# Patient Record
Sex: Female | Born: 1955 | Race: Black or African American | Hispanic: No | Marital: Married | Smoking: Former smoker
Health system: Southern US, Community
[De-identification: ages and names within clinical notes are randomized; demographics above are authoritative.]

## PROBLEM LIST (undated history)

## (undated) DIAGNOSIS — D649 Anemia, unspecified: Secondary | ICD-10-CM

## (undated) DIAGNOSIS — I4891 Unspecified atrial fibrillation: Secondary | ICD-10-CM

## (undated) DIAGNOSIS — M1990 Unspecified osteoarthritis, unspecified site: Secondary | ICD-10-CM

## (undated) DIAGNOSIS — I10 Essential (primary) hypertension: Secondary | ICD-10-CM

## (undated) HISTORY — PX: ABDOMINAL HYSTERECTOMY: SHX81

---

## 1998-02-05 ENCOUNTER — Encounter: Payer: Self-pay | Admitting: Obstetrics and Gynecology

## 1998-02-05 ENCOUNTER — Ambulatory Visit (HOSPITAL_COMMUNITY): Admission: RE | Admit: 1998-02-05 | Discharge: 1998-02-05 | Payer: Self-pay | Admitting: Obstetrics and Gynecology

## 1998-06-28 ENCOUNTER — Ambulatory Visit (HOSPITAL_COMMUNITY): Admission: RE | Admit: 1998-06-28 | Discharge: 1998-06-28 | Payer: Self-pay | Admitting: Cardiology

## 2000-01-16 ENCOUNTER — Encounter: Payer: Self-pay | Admitting: Obstetrics and Gynecology

## 2000-01-16 ENCOUNTER — Ambulatory Visit (HOSPITAL_COMMUNITY): Admission: RE | Admit: 2000-01-16 | Discharge: 2000-01-16 | Payer: Self-pay | Admitting: Obstetrics and Gynecology

## 2000-02-10 ENCOUNTER — Other Ambulatory Visit: Admission: RE | Admit: 2000-02-10 | Discharge: 2000-02-10 | Payer: Self-pay | Admitting: Obstetrics and Gynecology

## 2001-02-05 ENCOUNTER — Ambulatory Visit (HOSPITAL_COMMUNITY): Admission: RE | Admit: 2001-02-05 | Discharge: 2001-02-05 | Payer: Self-pay | Admitting: Family Medicine

## 2001-02-05 ENCOUNTER — Encounter: Payer: Self-pay | Admitting: Family Medicine

## 2001-04-14 HISTORY — PX: REDUCTION MAMMAPLASTY: SUR839

## 2001-08-02 ENCOUNTER — Encounter (INDEPENDENT_AMBULATORY_CARE_PROVIDER_SITE_OTHER): Payer: Self-pay | Admitting: Specialist

## 2001-08-02 ENCOUNTER — Ambulatory Visit (HOSPITAL_COMMUNITY): Admission: RE | Admit: 2001-08-02 | Discharge: 2001-08-02 | Payer: Self-pay | Admitting: *Deleted

## 2001-09-23 ENCOUNTER — Encounter (INDEPENDENT_AMBULATORY_CARE_PROVIDER_SITE_OTHER): Payer: Self-pay

## 2001-09-23 ENCOUNTER — Ambulatory Visit (HOSPITAL_BASED_OUTPATIENT_CLINIC_OR_DEPARTMENT_OTHER): Admission: RE | Admit: 2001-09-23 | Discharge: 2001-09-24 | Payer: Self-pay | Admitting: Plastic Surgery

## 2002-06-28 ENCOUNTER — Encounter: Payer: Self-pay | Admitting: Emergency Medicine

## 2002-06-28 ENCOUNTER — Emergency Department (HOSPITAL_COMMUNITY): Admission: EM | Admit: 2002-06-28 | Discharge: 2002-06-28 | Payer: Self-pay | Admitting: Emergency Medicine

## 2003-02-01 ENCOUNTER — Ambulatory Visit (HOSPITAL_COMMUNITY): Admission: RE | Admit: 2003-02-01 | Discharge: 2003-02-01 | Payer: Self-pay | Admitting: Family Medicine

## 2003-02-01 ENCOUNTER — Encounter: Payer: Self-pay | Admitting: Family Medicine

## 2003-10-06 ENCOUNTER — Other Ambulatory Visit: Admission: RE | Admit: 2003-10-06 | Discharge: 2003-10-06 | Payer: Self-pay | Admitting: Family Medicine

## 2004-04-03 ENCOUNTER — Ambulatory Visit (HOSPITAL_COMMUNITY): Admission: RE | Admit: 2004-04-03 | Discharge: 2004-04-03 | Payer: Self-pay | Admitting: Family Medicine

## 2004-04-25 ENCOUNTER — Encounter: Admission: RE | Admit: 2004-04-25 | Discharge: 2004-04-25 | Payer: Self-pay | Admitting: Family Medicine

## 2004-11-26 ENCOUNTER — Other Ambulatory Visit: Admission: RE | Admit: 2004-11-26 | Discharge: 2004-11-26 | Payer: Self-pay | Admitting: Family Medicine

## 2005-05-19 ENCOUNTER — Ambulatory Visit (HOSPITAL_COMMUNITY): Admission: RE | Admit: 2005-05-19 | Discharge: 2005-05-19 | Payer: Self-pay | Admitting: Family Medicine

## 2006-02-10 ENCOUNTER — Other Ambulatory Visit: Admission: RE | Admit: 2006-02-10 | Discharge: 2006-02-10 | Payer: Self-pay | Admitting: Family Medicine

## 2006-04-28 ENCOUNTER — Encounter: Admission: RE | Admit: 2006-04-28 | Discharge: 2006-04-28 | Payer: Self-pay | Admitting: Family Medicine

## 2006-05-22 ENCOUNTER — Ambulatory Visit (HOSPITAL_COMMUNITY): Admission: RE | Admit: 2006-05-22 | Discharge: 2006-05-22 | Payer: Self-pay | Admitting: Family Medicine

## 2007-05-31 ENCOUNTER — Ambulatory Visit (HOSPITAL_COMMUNITY): Admission: RE | Admit: 2007-05-31 | Discharge: 2007-05-31 | Payer: Self-pay | Admitting: Family Medicine

## 2008-05-17 ENCOUNTER — Other Ambulatory Visit: Admission: RE | Admit: 2008-05-17 | Discharge: 2008-05-17 | Payer: Self-pay | Admitting: Family Medicine

## 2008-06-07 ENCOUNTER — Ambulatory Visit (HOSPITAL_COMMUNITY): Admission: RE | Admit: 2008-06-07 | Discharge: 2008-06-07 | Payer: Self-pay | Admitting: Family Medicine

## 2008-07-20 ENCOUNTER — Encounter: Admission: RE | Admit: 2008-07-20 | Discharge: 2008-07-20 | Payer: Self-pay | Admitting: Family Medicine

## 2009-06-11 ENCOUNTER — Ambulatory Visit (HOSPITAL_COMMUNITY): Admission: RE | Admit: 2009-06-11 | Discharge: 2009-06-11 | Payer: Self-pay | Admitting: Family Medicine

## 2010-05-07 ENCOUNTER — Other Ambulatory Visit: Payer: Self-pay | Admitting: Family Medicine

## 2010-05-07 DIAGNOSIS — Z1239 Encounter for other screening for malignant neoplasm of breast: Secondary | ICD-10-CM

## 2010-06-12 ENCOUNTER — Ambulatory Visit: Payer: Self-pay

## 2010-06-13 ENCOUNTER — Other Ambulatory Visit (HOSPITAL_COMMUNITY): Payer: Self-pay | Admitting: Family Medicine

## 2010-06-13 ENCOUNTER — Ambulatory Visit: Payer: Self-pay

## 2010-06-13 DIAGNOSIS — Z1231 Encounter for screening mammogram for malignant neoplasm of breast: Secondary | ICD-10-CM

## 2010-06-19 ENCOUNTER — Ambulatory Visit (HOSPITAL_COMMUNITY)
Admission: RE | Admit: 2010-06-19 | Discharge: 2010-06-19 | Disposition: A | Discharge status: Home / Self Care | Payer: BC Managed Care – PPO | Source: Ambulatory Visit | Attending: Family Medicine | Admitting: Family Medicine

## 2010-06-19 DIAGNOSIS — Z1231 Encounter for screening mammogram for malignant neoplasm of breast: Secondary | ICD-10-CM | POA: Insufficient documentation

## 2010-06-24 ENCOUNTER — Other Ambulatory Visit (HOSPITAL_COMMUNITY)
Admission: RE | Admit: 2010-06-24 | Discharge: 2010-06-24 | Disposition: A | Discharge status: Home / Self Care | Payer: BC Managed Care – PPO | Source: Ambulatory Visit | Attending: Family Medicine | Admitting: Family Medicine

## 2010-06-24 ENCOUNTER — Other Ambulatory Visit: Payer: Self-pay | Admitting: Family Medicine

## 2010-06-24 DIAGNOSIS — Z124 Encounter for screening for malignant neoplasm of cervix: Secondary | ICD-10-CM | POA: Insufficient documentation

## 2010-08-30 NOTE — Op Note (Signed)
Marissa Torres. [facility]  Patient:    Marissa Torres, Marissa Torres Visit Number: [id number] MRN: [id number]          Service Type: DSU Location: [facility] Attending Physician:  Eloise Levels Dictated by:   Mary A. Contogiannis, M.D. Proc. Date: 09/23/01 Admit Date:  09/23/2001 Discharge Date: 09/24/2001   CC:         Harl Bowie, M.D.   Operative Report  PREOPERATIVE DIAGNOSIS:  Bilateral macromastia.  POSTOPERATIVE DIAGNOSIS:  Bilateral macromastia.  PROCEDURE:  Bilateral reduction mammoplasties.  SURGEON:  Mary A. Contogiannis, M.D.  ASSISTANT:  Harl Bowie, M.D.  ANESTHESIA:  General endotracheal.  ANESTHESIOLOGISTS:  Maren Beach, M.D., and Bedelia Person, M.D.  ESTIMATED BLOOD LOSS:  225 cc.  FLUID REPLACEMENT:  1800 cc crystalloid.  URINE OUTPUT:  1000 cc.  COMPLICATIONS:  None.  JUSTIFICATION FOR OVERNIGHT STAY:  Progressive pain control along with ambulation and monitoring of the nipples and breast flaps.  AMOUNT OF BREAST TISSUE REMOVED:  Right breast, 794 g; left breast, 1072 g.  INDICATION FOR PROCEDURE:  The patient is a 55 year old African-American female who has bilateral macromastia that is clinically symptomatic.  She presents to undergo bilateral reduction mammoplasties.  DESCRIPTION OF PROCEDURE:  The patient was marked in the preop holding area in the pattern of Wise for the future bilateral reduction mammoplasties.  She was then taken back into the OR and placed on the table in supine position.  After adequate general endotracheal anesthesia was obtained, the patients chest was prepped with Betadine and alcohol and draped in sterile fashion.  The base of the breasts were then injected with 1% lidocaine with epinephrine.  After adequate hemostasis had taken effect, the procedure was begun.  First the right breast reduction was performed.  The nipple was marked with the 45 mm nipple marker.  This incision was then made  and the skin de-epithelialized around it down to the inframammary crease in the inferior pedicle pattern.  Next the medial, superior, and lateral skin flaps were elevated down to the chest wall.  Excess fat and glandular tissue were removed from the inferior pedicle.  The nipple was examined and found to be pink and viable.  The wound was irrigated with saline irrigation.  Meticulous hemostasis was obtained with the Bovie electrocautery.  The inferior pedicle was then centralized using 3-0 Prolene suture.  A #10 JP flat, fully-fluted drain was then placed into the wound.  The skin flaps were brought together at the inverted T junction with a 2-0 Prolene suture.  The incisions were stapled for temporary closure.  Attention was then turned to the left breast.  The nipple was marked with the 45 mm nipple marker, and this was incised.  The skin de-epithelialized around it down to the inframammary crease in the inferior pedicle pattern.  Next the medial, superior, and lateral skin flaps were elevated down to the chest wall.  The excess fat and glandular tissue were removed from the inferior pedicle. The nipple was examined and found to be pink and viable.  The inferior pedicle was centralized using 3-0 Prolene suture.  A #10 JP flat, fully-fluted drain was then placed into the wound and the skin flap was brought together at the inverted T junction with a 2-0 Prolene suture.  The incisions were stapled for temporary closure.  The breasts were then compared and found to have good shape and symmetry.  The staples were then removed and all the incisions closed  using 3-0 Monocryl in the dermal layer and a 4-0 Monocryl running intracuticular stitch on the skin.  The patient was then placed in the upright position and the location of the future nipples was marked using the 42 mm nipple marker.  She was then placed back to the recumbent position.  First the future nipple-areolar complex on the right  breast mound was incised.  This tissue was excised full-thickness. The nipple was then examined and found to be pink and viable.  It was then brought out into this aperture and sewn in place using 4-0 Monocryl interrupted dermal sutures followed by a 5-0 Monocryl running intracuticular stitch on the skin.  Attention was then turned to the left breast.  The area of the future nipple-areolar complex was then incised, and this tissue was excised full-thickness.  The nipple was then examined and was pink and viable. It was then brought out into this aperture and sewn in place using 4-0 Monocryl interrupted dermal sutures followed by a 5-0 Monocryl running intracuticular stitch on the skin.  The JP drains were sewn in place using 3-0 nylon suture.  The incisions were dressed with benzoin and Steri-Strips.  The nipples were additionally dressed with bacitracin ointment and Adaptic.  Four by fours were then placed over all the incisions and the patient was placed in a light postoperative support bra.  There were no complications.  The patient tolerated the procedure well.  Final needle and sponge counts were reported to be correct.  The patient was then extubated and taken to the recovery room in stable condition.  She will remain overnight in the [facility] for progressive pain control along with ambulation and monitoring of the nipples and breast flaps. Discharge is planned for the morning. Dictated by:   Mary A. Contogiannis, M.D. Attending Physician:  Eloise Levels DD:  09/23/01 TD:  09/25/01 Job: [id number]

## 2011-05-13 ENCOUNTER — Other Ambulatory Visit (HOSPITAL_COMMUNITY): Payer: Self-pay | Admitting: Family Medicine

## 2011-05-13 DIAGNOSIS — Z1231 Encounter for screening mammogram for malignant neoplasm of breast: Secondary | ICD-10-CM

## 2011-06-20 ENCOUNTER — Ambulatory Visit (HOSPITAL_COMMUNITY)
Admission: RE | Admit: 2011-06-20 | Discharge: 2011-06-20 | Disposition: A | Discharge status: Home / Self Care | Payer: BC Managed Care – PPO | Source: Ambulatory Visit | Attending: Family Medicine | Admitting: Family Medicine

## 2011-06-20 DIAGNOSIS — Z1231 Encounter for screening mammogram for malignant neoplasm of breast: Secondary | ICD-10-CM

## 2011-07-27 ENCOUNTER — Emergency Department (HOSPITAL_COMMUNITY): Payer: BC Managed Care – PPO

## 2011-07-27 ENCOUNTER — Emergency Department (HOSPITAL_COMMUNITY)
Admission: EM | Admit: 2011-07-27 | Discharge: 2011-07-27 | Disposition: A | Discharge status: Home / Self Care | Payer: BC Managed Care – PPO | Attending: Emergency Medicine | Admitting: Emergency Medicine

## 2011-07-27 ENCOUNTER — Encounter (HOSPITAL_COMMUNITY): Payer: Self-pay | Admitting: *Deleted

## 2011-07-27 DIAGNOSIS — T148XXA Other injury of unspecified body region, initial encounter: Secondary | ICD-10-CM | POA: Insufficient documentation

## 2011-07-27 DIAGNOSIS — M549 Dorsalgia, unspecified: Secondary | ICD-10-CM | POA: Insufficient documentation

## 2011-07-27 DIAGNOSIS — R109 Unspecified abdominal pain: Secondary | ICD-10-CM | POA: Insufficient documentation

## 2011-07-27 DIAGNOSIS — I10 Essential (primary) hypertension: Secondary | ICD-10-CM | POA: Insufficient documentation

## 2011-07-27 DIAGNOSIS — X58XXXA Exposure to other specified factors, initial encounter: Secondary | ICD-10-CM | POA: Insufficient documentation

## 2011-07-27 HISTORY — DX: Essential (primary) hypertension: I10

## 2011-07-27 LAB — URINALYSIS, ROUTINE W REFLEX MICROSCOPIC
Ketones, ur: NEGATIVE mg/dL
Nitrite: NEGATIVE
Urobilinogen, UA: 0.2 mg/dL (ref 0.0–1.0)

## 2011-07-27 MED ORDER — DIAZEPAM 5 MG/ML IJ SOLN
5.0000 mg | Freq: Once | INTRAMUSCULAR | Status: AC
  Administered 2011-07-27: 5 mg via INTRAMUSCULAR
  Filled 2011-07-27: qty 2

## 2011-07-27 MED ORDER — DIAZEPAM 5 MG PO TABS: 5.0000 mg | ORAL_TABLET | Freq: Three times a day (TID) | ORAL | Status: AC | PRN

## 2011-07-27 MED ORDER — IBUPROFEN 800 MG PO TABS: 800.0000 mg | ORAL_TABLET | Freq: Three times a day (TID) | ORAL | Status: AC | PRN

## 2011-07-27 NOTE — ED Notes (Signed)
PT c/o low back spasms x 1 day, worsening. Denies injury and urinary s/s. Denies chronicity.

## 2011-07-27 NOTE — Discharge Instructions (Signed)
Please read the information below.  Followup with your primary care provider next week if you continue to have pain.  If you develop fevers, abdominal pain, weakness or numbness of your legs, or loss of control of bowel or bladder, return to the emergency room immediately.  You may return to the ER at any time for worsening condition or any new symptoms that concern you.  Back Pain, Adult Low back pain is very common. About 1 in 5 people have back pain.The cause of low back pain is rarely dangerous. The pain often gets better over time.About half of people with a sudden onset of back pain feel better in just 2 weeks. About 8 in 10 people feel better by 6 weeks.  CAUSES Some common causes of back pain include:  Strain of the muscles or ligaments supporting the spine.   Wear and tear (degeneration) of the spinal discs.   Arthritis.   Direct injury to the back.  DIAGNOSIS Most of the time, the direct cause of low back pain is not known.However, back pain can be treated effectively even when the exact cause of the pain is unknown.Answering your caregiver's questions about your overall health and symptoms is one of the most accurate ways to make sure the cause of your pain is not dangerous. If your caregiver needs more information, he or she may order lab work or imaging tests (X-rays or MRIs).However, even if imaging tests show changes in your back, this usually does not require surgery. HOME CARE INSTRUCTIONS For many people, back pain returns.Since low back pain is rarely dangerous, it is often a condition that people can learn to [facility] their own.   Remain active. It is stressful on the back to sit or stand in one place. Do not sit, drive, or stand in one place for more than 30 minutes at a time. Take short walks on level surfaces as soon as pain allows.Try to increase the length of time you walk each day.   Do not stay in bed.Resting more than 1 or 2 days can delay your recovery.    Do not avoid exercise or work.Your body is made to move.It is not dangerous to be active, even though your back may hurt.Your back will likely heal faster if you return to being active before your pain is gone.   Pay attention to your body when you bend and lift. Many people have less discomfortwhen lifting if they bend their knees, keep the load close to their bodies,and avoid twisting. Often, the most comfortable positions are those that put less stress on your recovering back.   Find a comfortable position to sleep. Use a firm mattress and lie on your side with your knees slightly bent. If you lie on your back, put a pillow under your knees.   Only take over-the-counter or prescription medicines as directed by your caregiver. Over-the-counter medicines to reduce pain and inflammation are often the most helpful.Your caregiver may prescribe muscle relaxant drugs.These medicines help dull your pain so you can more quickly return to your normal activities and healthy exercise.   Put ice on the injured area.   Put ice in a plastic bag.   Place a towel between your skin and the bag.   Leave the ice on for 15 to 20 minutes, 3 to 4 times a day for the first 2 to 3 days. After that, ice and heat may be alternated to reduce pain and spasms.   Ask your caregiver about  trying back exercises and gentle massage. This may be of some benefit.   Avoid feeling anxious or stressed.Stress increases muscle tension and can worsen back pain.It is important to recognize when you are anxious or stressed and learn ways to manage it.Exercise is a great option.  SEEK MEDICAL CARE IF:  You have pain that is not relieved with rest or medicine.   You have pain that does not improve in 1 week.   You have new symptoms.   You are generally not feeling well.  SEEK IMMEDIATE MEDICAL CARE IF:   You have pain that radiates from your back into your legs.   You develop new bowel or bladder control  problems.   You have unusual weakness or numbness in your arms or legs.   You develop nausea or vomiting.   You develop abdominal pain.   You feel faint.  Document Released: 03/31/2005 Document Revised: 03/20/2011 Document Reviewed: 08/19/2010 [facility] Patient Information 2012 [place], [place].  Lumbosacral Strain Lumbosacral strain is one of the most common causes of back pain. There are many causes of back pain. Most are not serious conditions. CAUSES  Your backbone (spinal column) is made up of 24 main vertebral bodies, the sacrum, and the coccyx. These are held together by muscles and tough, fibrous tissue (ligaments). Nerve roots pass through the openings between the vertebrae. A sudden move or injury to the back may cause injury to, or pressure on, these nerves. This may result in localized back pain or pain movement (radiation) into the buttocks, down the leg, and into the foot. Sharp, shooting pain from the buttock down the back of the leg (sciatica) is frequently associated with a ruptured (herniated) disk. Pain may be caused by muscle spasm alone. Your caregiver can often find the cause of your pain by the details of your symptoms and an exam. In some cases, you may need tests (such as X-rays). Your caregiver will work with you to decide if any tests are needed based on your specific exam. HOME CARE INSTRUCTIONS   Avoid an underactive lifestyle. Active exercise, as directed by your caregiver, is your greatest weapon against back pain.   Avoid hard physical activities (tennis, racquetball, waterskiing) if you are not in proper physical condition for it. This may aggravate or create problems.   If you have a back problem, avoid sports requiring sudden body movements. Swimming and walking are generally safer activities.   Maintain good posture.   Avoid becoming overweight (obese).   Use bed rest for only the most extreme, sudden (acute) episode. Your caregiver will help you  determine how much bed rest is necessary.   For acute conditions, you may put ice on the injured area.   Put ice in a plastic bag.   Place a towel between your skin and the bag.   Leave the ice on for 15 to 20 minutes at a time, every 2 hours, or as needed.   After you are improved and more active, it may help to apply heat for 30 minutes before activities.  See your caregiver if you are having pain that lasts longer than expected. Your caregiver can advise appropriate exercises or therapy if needed. With conditioning, most back problems can be avoided. SEEK IMMEDIATE MEDICAL CARE IF:   You have numbness, tingling, weakness, or problems with the use of your arms or legs.   You experience severe back pain not relieved with medicines.   There is a change in bowel or bladder  control.   You have increasing pain in any area of the body, including your belly (abdomen).   You notice shortness of breath, dizziness, or feel faint.   You feel sick to your stomach (nauseous), are throwing up (vomiting), or become sweaty.   You notice discoloration of your toes or legs, or your feet get very cold.   Your back pain is getting worse.   You have a fever.  MAKE SURE YOU:   Understand these instructions.   Will watch your condition.   Will get help right away if you are not doing well or get worse.  Document Released: 01/08/2005 Document Revised: 03/20/2011 Document Reviewed: 06/30/2008 [facility] Patient Information 2012 [place], [place].

## 2011-07-27 NOTE — ED Provider Notes (Signed)
History     CSN: [id number]  Arrival date & time 07/27/11  7829   First MD Initiated Contact with Patient 07/27/11 [phone]      Chief Complaint  Patient presents with  . Back Pain    (Consider location/radiation/quality/duration/timing/severity/associated sxs/prior treatment) HPI Comments: Patient reports she began having left sided flank/back pain yesterday.  States the pain began in her LLQ, then moved to her left back.  Pain is alleviated by sitting completely still, is exacerbated by any movement, any lifting or bending.  Pt did take a laxative initially thinking that the cause of her pain was constipation. Denies fevers, N/V, urinary symptoms, hematuria, change in bowel habits.  The pain does not radiate.  Denies weakness or numbness of her extremities.    Patient is a 56 y.o. female presenting with back pain. The history is provided by the patient.  Back Pain  Pertinent negatives include no chest pain, no fever, no numbness, no dysuria and no weakness.    Past Medical History  Diagnosis Date  . Hypertension     History reviewed. No pertinent past surgical history.  History reviewed. No pertinent family history.  History  Substance Use Topics  . Smoking status: Former Games developer  . Smokeless tobacco: Not on file  . Alcohol Use: No    OB History    Grav Para Term Preterm Abortions TAB SAB Ect Mult Living                  Review of Systems  Constitutional: Negative for fever.  Respiratory: Negative for cough and shortness of breath.   Cardiovascular: Negative for chest pain.  Gastrointestinal: Negative for vomiting and diarrhea.  Genitourinary: Negative for dysuria, urgency, frequency, hematuria and menstrual problem.  Musculoskeletal: Positive for back pain.  Neurological: Negative for syncope, weakness and numbness.  All other systems reviewed and are negative.    Allergies  Review of patient's allergies indicates no known allergies.  Home Medications    Current Outpatient Rx  Name Route Sig Dispense Refill  . ATENOLOL 25 MG PO TABS Oral Take 25 mg by mouth daily.      BP 141/88  Pulse 97  Temp(Src) 98.3 F (36.8 C) (Oral)  Resp 18  SpO2 98%  Physical Exam  Nursing note and vitals reviewed. Constitutional: She is oriented to person, place, and time. She appears well-developed and well-nourished.  HENT:  Head: Normocephalic and atraumatic.  Neck: Neck supple.  Cardiovascular: Normal rate, regular rhythm and normal heart sounds.   Pulmonary/Chest: Breath sounds normal. No respiratory distress. She has no wheezes. She has no rales. She exhibits no tenderness.  Abdominal: Soft. Bowel sounds are normal. She exhibits no distension. There is tenderness. There is no rebound and no guarding.    Musculoskeletal: Normal range of motion. She exhibits no edema and no tenderness.       Cervical back: Normal.       Thoracic back: Normal.       Lumbar back: Normal.       Arms:      Lower extremities: strength 5/5, sensation intact, distal pulses intact.  Neurological: She is alert and oriented to person, place, and time. She exhibits normal muscle tone. Coordination normal.  Psychiatric: She has a normal mood and affect. Her behavior is normal. Judgment and thought content normal.    ED Course  Procedures (including critical care time)   Labs Reviewed  URINALYSIS, ROUTINE W REFLEX MICROSCOPIC  URINE CULTURE   Dg  Abd 1 View  07/27/2011  *RADIOLOGY REPORT*  Clinical Data: Left abdominal pain.  ABDOMEN - 1 VIEW  Comparison: 07/20/2008 CT  Findings: The bowel gas pattern is unremarkable. No suspicious calcifications are present. No acute bony abnormalities are noted.  IMPRESSION: No evidence of acute abnormality.  Unremarkable bowel gas pattern.  Original Report Authenticated By: Rosendo Gros, M.D.   11:20 AM Patient reports great improvement with toradol.  I discussed the results with the patient, advised that she should return for any  new or worsening symptoms.  Patient verbalizes understanding and agrees with plan.    1. Muscle strain   2. Back pain       MDM  Patient presented to ED with left back pain.  Although distribution of pain is someone left flank, patient's pain is only present with movement.  She has normal UA and KUB film.  Doubt kidney or ureteral stone.  Pt has no other symptoms to suggest intraabdominal process.  Pt had complete relief with valium. I have discussed all of this with her and advised that she return for any new or changing symptoms.  Pt d/c home with valium and ibuprofen, return precautions.  Patient verbalizes understanding and agrees with plan.         Rise Patience, [place] 07/27/11 1204

## 2011-07-28 LAB — URINE CULTURE: Culture  Setup Time: 201304141555

## 2011-07-29 NOTE — ED Provider Notes (Signed)
Medical screening examination/treatment/procedure(s) were performed by non-physician practitioner and as supervising physician I was immediately available for consultation/collaboration.  Geoffery Lyons, MD 07/29/11 [phone]

## 2012-01-16 ENCOUNTER — Other Ambulatory Visit: Payer: Self-pay | Admitting: Gastroenterology

## 2012-02-12 ENCOUNTER — Other Ambulatory Visit: Payer: Self-pay | Admitting: Gastroenterology

## 2012-02-12 DIAGNOSIS — K5289 Other specified noninfective gastroenteritis and colitis: Secondary | ICD-10-CM

## 2012-02-12 DIAGNOSIS — R1032 Left lower quadrant pain: Secondary | ICD-10-CM

## 2012-02-13 ENCOUNTER — Ambulatory Visit
Admission: RE | Admit: 2012-02-13 | Discharge: 2012-02-13 | Disposition: A | Discharge status: Home / Self Care | Payer: BC Managed Care – PPO | Source: Ambulatory Visit | Attending: Gastroenterology | Admitting: Gastroenterology

## 2012-02-13 DIAGNOSIS — R1032 Left lower quadrant pain: Secondary | ICD-10-CM

## 2012-02-13 DIAGNOSIS — K5289 Other specified noninfective gastroenteritis and colitis: Secondary | ICD-10-CM

## 2012-02-13 MED ORDER — IOHEXOL 300 MG/ML  SOLN
100.0000 mL | Freq: Once | INTRAMUSCULAR | Status: AC | PRN
  Administered 2012-02-13: 100 mL via INTRAVENOUS

## 2012-07-21 ENCOUNTER — Emergency Department (HOSPITAL_BASED_OUTPATIENT_CLINIC_OR_DEPARTMENT_OTHER)
Admission: EM | Admit: 2012-07-21 | Discharge: 2012-07-22 | Disposition: A | Discharge status: Home / Self Care | Payer: BC Managed Care – PPO | Attending: Emergency Medicine | Admitting: Emergency Medicine

## 2012-07-21 ENCOUNTER — Encounter (HOSPITAL_BASED_OUTPATIENT_CLINIC_OR_DEPARTMENT_OTHER): Payer: Self-pay | Admitting: Emergency Medicine

## 2012-07-21 DIAGNOSIS — R42 Dizziness and giddiness: Secondary | ICD-10-CM

## 2012-07-21 DIAGNOSIS — Z79899 Other long term (current) drug therapy: Secondary | ICD-10-CM | POA: Insufficient documentation

## 2012-07-21 DIAGNOSIS — R51 Headache: Secondary | ICD-10-CM | POA: Insufficient documentation

## 2012-07-21 DIAGNOSIS — Z87891 Personal history of nicotine dependence: Secondary | ICD-10-CM | POA: Insufficient documentation

## 2012-07-21 DIAGNOSIS — I10 Essential (primary) hypertension: Secondary | ICD-10-CM | POA: Insufficient documentation

## 2012-07-21 NOTE — ED Notes (Signed)
Pt states she has not been feeling well today. Pt reports 2 episodes of dizziness tonight. Pt had blood pressure checked by fireman and it was elevated.

## 2012-07-22 LAB — URINE MICROSCOPIC-ADD ON

## 2012-07-22 LAB — CBC WITH DIFFERENTIAL/PLATELET
Basophils Absolute: 0 10*3/uL (ref 0.0–0.1)
Basophils Relative: 1 % (ref 0–1)
Eosinophils Absolute: 0.1 10*3/uL (ref 0.0–0.7)
Eosinophils Relative: 3 % (ref 0–5)
Hemoglobin: 11.8 g/dL — ABNORMAL LOW (ref 12.0–15.0)
Lymphocytes Relative: 43 % (ref 12–46)
MCH: 28 pg (ref 26.0–34.0)
MCHC: 32.7 g/dL (ref 30.0–36.0)
Neutro Abs: 2.6 10*3/uL (ref 1.7–7.7)
Neutrophils Relative %: 46 % (ref 43–77)
RBC: 4.21 MIL/uL (ref 3.87–5.11)

## 2012-07-22 LAB — BASIC METABOLIC PANEL
CO2: 27 mEq/L (ref 19–32)
Creatinine, Ser: 0.8 mg/dL (ref 0.50–1.10)
GFR calc Af Amer: >90 mL/min (ref >90)

## 2012-07-22 LAB — URINALYSIS, ROUTINE W REFLEX MICROSCOPIC
Glucose, UA: NEGATIVE mg/dL
Protein, ur: NEGATIVE mg/dL

## 2012-07-22 MED ORDER — HYDROCHLOROTHIAZIDE 25 MG PO TABS
25.0000 mg | ORAL_TABLET | Freq: Every day | ORAL | Status: DC
  Administered 2012-07-22: 25 mg via ORAL

## 2012-07-22 MED ORDER — MECLIZINE HCL 25 MG PO TABS: 25.0000 mg | ORAL_TABLET | Freq: Three times a day (TID) | ORAL | Status: DC | PRN

## 2012-07-22 MED ORDER — HYDROCHLOROTHIAZIDE 25 MG PO TABS: 25.0000 mg | ORAL_TABLET | Freq: Every day | ORAL | Status: DC

## 2012-07-22 MED ORDER — HYDROCHLOROTHIAZIDE 25 MG PO TABS
ORAL_TABLET | ORAL | Status: AC
  Administered 2012-07-22: 25 mg via ORAL
  Filled 2012-07-22: qty 1

## 2012-07-22 NOTE — ED Provider Notes (Signed)
History     CSN: [id number]  Arrival date & time 07/21/12  2040   First MD Initiated Contact with Patient 07/22/12 0051      Chief Complaint  Patient presents with  . Dizziness    (Consider location/radiation/quality/duration/timing/severity/associated sxs/prior treatment) HPI This is a 57 year old female with a history of hypertension on atenolol. She has been compliant with her medication. She is here with a dizziness that began about 3 yesterday afternoon. She describes the dizziness as a sensation that her surroundings were spinning. It was worse with movement of the head. There was no associated nausea or vomiting. There was no associated tinnitus or hearing change. It has been associated with a mild headache. The dizziness abated about 9 PM yesterday evening but the headache persists. Her blood pressure was noted to be about 210/107 prior to arrival. On arrival it was 176/104. She denies chest pain or shortness of breath. She denies any focal neurologic changes.  Past Medical History  Diagnosis Date  . Hypertension     Past Surgical History  Procedure Laterality Date  . Cesarean section    . Abdominal hysterectomy      No family history on file.  History  Substance Use Topics  . Smoking status: Former Games developer  . Smokeless tobacco: Not on file  . Alcohol Use: No    OB History   Grav Para Term Preterm Abortions TAB SAB Ect Mult Living                  Review of Systems  All other systems reviewed and are negative.    Allergies  Review of patient's allergies indicates no known allergies.  Home Medications   Current Outpatient Rx  Name  Route  Sig  Dispense  Refill  . atenolol (TENORMIN) 25 MG tablet   Oral   Take 25 mg by mouth daily.           BP 142/83  Pulse 67  Temp(Src) 97.8 F (36.6 C) (Oral)  Resp 18  Ht 5\' 5"  (1.651 m)  Wt 179 lb (81.194 kg)  BMI 29.79 kg/m2  SpO2 100%  Physical Exam General: Well-developed, well-nourished female in  no acute distress; appearance consistent with age of record HENT: normocephalic, atraumatic Eyes: pupils equal round and reactive to light; extraocular muscles intact; no nystagmus Neck: supple Heart: regular rate and rhythm Lungs: clear to auscultation bilaterally Abdomen: soft; nondistended; nontender; bowel sounds present Extremities: No deformity; full range of motion; pulses normal Neurologic: Awake, alert and oriented; motor function intact in all extremities and symmetric; no facial droop; normal coordination speech; normal finger to nose; negative Romberg Skin: Warm and dry Psychiatric: Normal mood and affect    ED Course  Procedures (including critical care time)     MDM   Nursing notes and vitals signs, including pulse oximetry, reviewed.  Summary of this visit's results, reviewed by myself:  Labs:  Results for orders placed during the hospital encounter of 07/21/12 (from the past 24 hour(s))  CBC WITH DIFFERENTIAL     Status: Abnormal   Collection Time    07/22/12  1:14 AM      Result Value Range   WBC 5.6  4.0 - 10.5 K/uL   RBC 4.21  3.87 - 5.11 MIL/uL   Hemoglobin 11.8 (*) 12.0 - 15.0 g/dL   HCT 40.9  81.1 - 91.4 %   MCV 85.7  78.0 - 100.0 fL   MCH 28.0  26.0 - 34.0 pg  MCHC 32.7  30.0 - 36.0 g/dL   RDW 41.3  24.4 - 01.0 %   Platelets 276  150 - 400 K/uL   Neutrophils Relative 46  43 - 77 %   Neutro Abs 2.6  1.7 - 7.7 K/uL   Lymphocytes Relative 43  12 - 46 %   Lymphs Abs 2.4  0.7 - 4.0 K/uL   Monocytes Relative 8  3 - 12 %   Monocytes Absolute 0.4  0.1 - 1.0 K/uL   Eosinophils Relative 3  0 - 5 %   Eosinophils Absolute 0.1  0.0 - 0.7 K/uL   Basophils Relative 1  0 - 1 %   Basophils Absolute 0.0  0.0 - 0.1 K/uL  BASIC METABOLIC PANEL     Status: Abnormal   Collection Time    07/22/12  1:14 AM      Result Value Range   Sodium 139  135 - 145 mEq/L   Potassium 3.7  3.5 - 5.1 mEq/L   Chloride 104  96 - 112 mEq/L   CO2 27  19 - 32 mEq/L   Glucose,  Bld 92  70 - 99 mg/dL   BUN 11  6 - 23 mg/dL   Creatinine, Ser 2.72  0.50 - 1.10 mg/dL   Calcium 9.9  8.4 - 53.6 mg/dL   GFR calc non Af Amer 81 (*) >90 mL/min   GFR calc Af Amer >90  >90 mL/min  URINALYSIS, ROUTINE W REFLEX MICROSCOPIC     Status: Abnormal   Collection Time    07/22/12  1:15 AM      Result Value Range   Color, Urine YELLOW  YELLOW   APPearance CLEAR  CLEAR   Specific Gravity, Urine 1.007  1.005 - 1.030   pH 7.0  5.0 - 8.0   Glucose, UA NEGATIVE  NEGATIVE mg/dL   Hgb urine dipstick NEGATIVE  NEGATIVE   Bilirubin Urine NEGATIVE  NEGATIVE   Ketones, ur NEGATIVE  NEGATIVE mg/dL   Protein, ur NEGATIVE  NEGATIVE mg/dL   Urobilinogen, UA 0.2  0.0 - 1.0 mg/dL   Nitrite NEGATIVE  NEGATIVE   Leukocytes, UA TRACE (*) NEGATIVE  URINE MICROSCOPIC-ADD ON     Status: None   Collection Time    07/22/12  1:15 AM      Result Value Range   Squamous Epithelial / LPF RARE  RARE   WBC, UA 0-2  <3 WBC/hpf   RBC / HPF 0-2  <3 RBC/hpf   Bacteria, UA RARE  RARE   2:04 AM Blood pressure normal after taking 25 mg of hydrochlorothiazide. Patient asymptomatic at this time. We will prescribe hydrochlorothiazide and have her follow up with her primary care physician. Her symptoms were more consistent with peripheral vertigo more so than hypertensive encephalopathy.     Hanley Seamen, MD 07/22/12 [phone]

## 2012-08-13 ENCOUNTER — Other Ambulatory Visit (HOSPITAL_COMMUNITY): Payer: Self-pay | Admitting: Family Medicine

## 2012-08-13 DIAGNOSIS — Z1231 Encounter for screening mammogram for malignant neoplasm of breast: Secondary | ICD-10-CM

## 2012-08-27 ENCOUNTER — Ambulatory Visit (HOSPITAL_COMMUNITY)
Admission: RE | Admit: 2012-08-27 | Discharge: 2012-08-27 | Disposition: A | Discharge status: Home / Self Care | Payer: BC Managed Care – PPO | Source: Ambulatory Visit | Attending: Family Medicine | Admitting: Family Medicine

## 2012-08-27 DIAGNOSIS — Z1231 Encounter for screening mammogram for malignant neoplasm of breast: Secondary | ICD-10-CM | POA: Insufficient documentation

## 2012-12-02 ENCOUNTER — Other Ambulatory Visit: Payer: Self-pay | Admitting: Family Medicine

## 2012-12-02 DIAGNOSIS — M545 Low back pain: Secondary | ICD-10-CM

## 2012-12-05 ENCOUNTER — Ambulatory Visit
Admission: RE | Admit: 2012-12-05 | Discharge: 2012-12-05 | Disposition: A | Discharge status: Home / Self Care | Payer: BC Managed Care – PPO | Source: Ambulatory Visit | Attending: Family Medicine | Admitting: Family Medicine

## 2012-12-05 DIAGNOSIS — M545 Low back pain: Secondary | ICD-10-CM

## 2013-08-02 ENCOUNTER — Other Ambulatory Visit (HOSPITAL_COMMUNITY): Payer: Self-pay | Admitting: Family Medicine

## 2013-08-02 DIAGNOSIS — Z1231 Encounter for screening mammogram for malignant neoplasm of breast: Secondary | ICD-10-CM

## 2013-08-31 ENCOUNTER — Ambulatory Visit (HOSPITAL_COMMUNITY)
Admission: RE | Admit: 2013-08-31 | Discharge: 2013-08-31 | Disposition: A | Discharge status: Home / Self Care | Payer: BC Managed Care – PPO | Source: Ambulatory Visit | Attending: Family Medicine | Admitting: Family Medicine

## 2013-08-31 DIAGNOSIS — Z1231 Encounter for screening mammogram for malignant neoplasm of breast: Secondary | ICD-10-CM

## 2013-11-10 ENCOUNTER — Ambulatory Visit (HOSPITAL_COMMUNITY)
Admission: RE | Admit: 2013-11-10 | Discharge: 2013-11-10 | Disposition: A | Discharge status: Home / Self Care | Payer: BC Managed Care – PPO | Source: Ambulatory Visit | Attending: Rheumatology | Admitting: Rheumatology

## 2013-11-10 ENCOUNTER — Other Ambulatory Visit (HOSPITAL_COMMUNITY): Payer: Self-pay | Admitting: Rheumatology

## 2013-11-10 ENCOUNTER — Other Ambulatory Visit: Payer: Self-pay | Admitting: Rheumatology

## 2013-11-10 DIAGNOSIS — M7989 Other specified soft tissue disorders: Secondary | ICD-10-CM

## 2013-11-10 DIAGNOSIS — M25569 Pain in unspecified knee: Secondary | ICD-10-CM | POA: Insufficient documentation

## 2013-11-10 DIAGNOSIS — M25562 Pain in left knee: Secondary | ICD-10-CM

## 2013-11-10 DIAGNOSIS — M79609 Pain in unspecified limb: Secondary | ICD-10-CM

## 2013-11-10 DIAGNOSIS — R609 Edema, unspecified: Secondary | ICD-10-CM

## 2013-11-10 NOTE — Progress Notes (Signed)
VASCULAR LAB PRELIMINARY  PRELIMINARY  PRELIMINARY  PRELIMINARY  Left lower extremity venous Doppler completed.    Preliminary report:  There is no DVT or SVT noted in the left lower extremity.  Blaize Nipper, RVT 11/10/2013, 5:40 PM

## 2014-04-13 IMAGING — CR DG ABDOMEN 1V
1 series · 1 of 1 positions shown · non-contrast
Comparison: 07/20/2008 CT

CLINICAL DATA: Left abdominal pain.

ABDOMEN - 1 VIEW

[t abdomen supine]
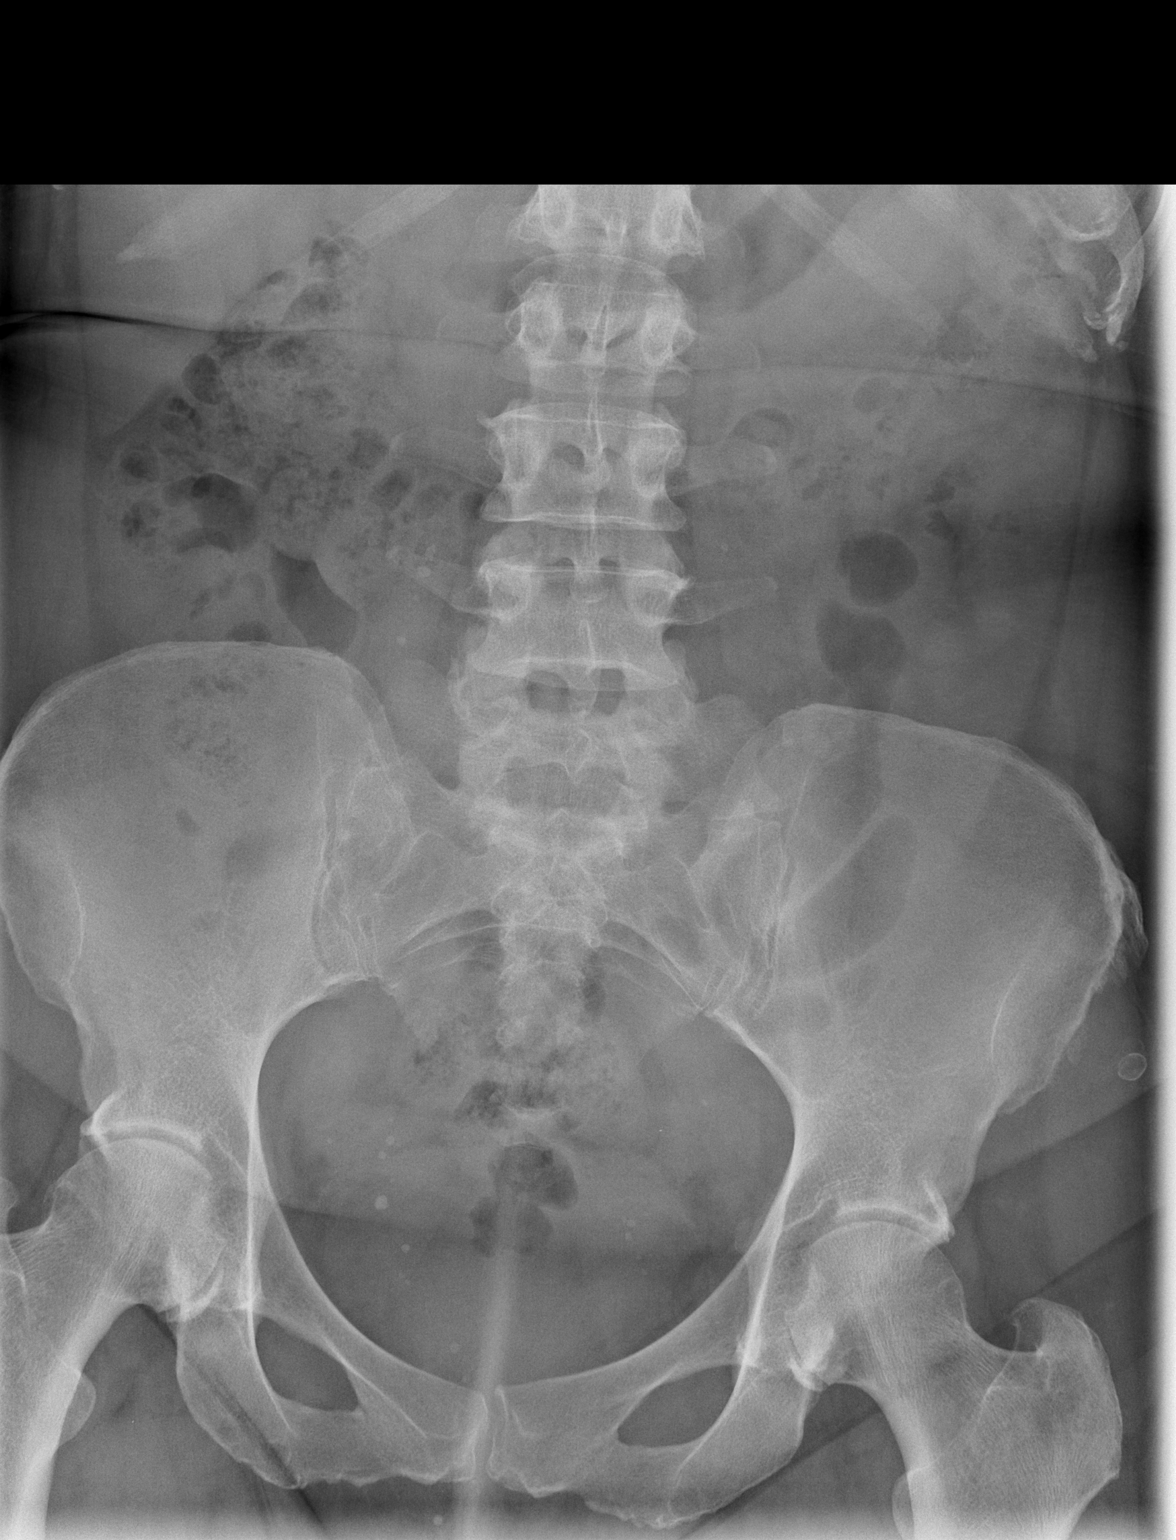

[1 of 1 positions shown; findings below may reference images not displayed]

FINDINGS: The bowel gas pattern is unremarkable.
No suspicious calcifications are present.
No acute bony abnormalities are noted.
IMPRESSION: No evidence of acute abnormality.  Unremarkable bowel gas pattern.

## 2014-08-15 ENCOUNTER — Other Ambulatory Visit (HOSPITAL_COMMUNITY): Payer: Self-pay | Admitting: Family Medicine

## 2014-08-15 DIAGNOSIS — Z1231 Encounter for screening mammogram for malignant neoplasm of breast: Secondary | ICD-10-CM

## 2014-09-04 ENCOUNTER — Ambulatory Visit (HOSPITAL_COMMUNITY)
Admission: RE | Admit: 2014-09-04 | Discharge: 2014-09-04 | Disposition: A | Discharge status: Home / Self Care | Payer: BC Managed Care – PPO | Source: Ambulatory Visit | Attending: Family Medicine | Admitting: Family Medicine

## 2014-09-04 DIAGNOSIS — Z1231 Encounter for screening mammogram for malignant neoplasm of breast: Secondary | ICD-10-CM | POA: Diagnosis present

## 2015-08-13 ENCOUNTER — Other Ambulatory Visit: Payer: Self-pay

## 2015-08-13 DIAGNOSIS — Z1231 Encounter for screening mammogram for malignant neoplasm of breast: Secondary | ICD-10-CM

## 2015-09-06 ENCOUNTER — Ambulatory Visit
Admission: RE | Admit: 2015-09-06 | Discharge: 2015-09-06 | Disposition: A | Discharge status: Home / Self Care | Payer: BC Managed Care – PPO | Source: Ambulatory Visit

## 2015-09-06 DIAGNOSIS — Z1231 Encounter for screening mammogram for malignant neoplasm of breast: Secondary | ICD-10-CM

## 2016-07-29 ENCOUNTER — Other Ambulatory Visit: Payer: Self-pay | Admitting: Family Medicine

## 2016-07-29 DIAGNOSIS — Z1231 Encounter for screening mammogram for malignant neoplasm of breast: Secondary | ICD-10-CM

## 2016-09-15 ENCOUNTER — Ambulatory Visit
Admission: RE | Admit: 2016-09-15 | Discharge: 2016-09-15 | Disposition: A | Discharge status: Home / Self Care | Payer: BC Managed Care – PPO | Source: Ambulatory Visit | Attending: Family Medicine | Admitting: Family Medicine

## 2016-09-15 DIAGNOSIS — Z1231 Encounter for screening mammogram for malignant neoplasm of breast: Secondary | ICD-10-CM

## 2017-01-21 ENCOUNTER — Emergency Department (HOSPITAL_COMMUNITY): Payer: BC Managed Care – PPO

## 2017-01-21 ENCOUNTER — Encounter (HOSPITAL_COMMUNITY): Payer: Self-pay | Admitting: Emergency Medicine

## 2017-01-21 ENCOUNTER — Observation Stay (HOSPITAL_COMMUNITY)
Admission: EM | Admit: 2017-01-21 | Discharge: 2017-01-22 | Disposition: A | Discharge status: Home / Self Care | Payer: BC Managed Care – PPO | Attending: Cardiovascular Disease | Admitting: Cardiovascular Disease

## 2017-01-21 DIAGNOSIS — R0789 Other chest pain: Secondary | ICD-10-CM

## 2017-01-21 DIAGNOSIS — I10 Essential (primary) hypertension: Secondary | ICD-10-CM | POA: Diagnosis not present

## 2017-01-21 DIAGNOSIS — R7989 Other specified abnormal findings of blood chemistry: Secondary | ICD-10-CM | POA: Diagnosis not present

## 2017-01-21 DIAGNOSIS — R002 Palpitations: Secondary | ICD-10-CM

## 2017-01-21 DIAGNOSIS — Z87891 Personal history of nicotine dependence: Secondary | ICD-10-CM | POA: Diagnosis not present

## 2017-01-21 DIAGNOSIS — R0602 Shortness of breath: Secondary | ICD-10-CM

## 2017-01-21 DIAGNOSIS — Z79899 Other long term (current) drug therapy: Secondary | ICD-10-CM | POA: Insufficient documentation

## 2017-01-21 DIAGNOSIS — R55 Syncope and collapse: Principal | ICD-10-CM | POA: Diagnosis present

## 2017-01-21 LAB — CBC
HEMATOCRIT: 37.4 % (ref 36.0–46.0)
Hemoglobin: 11.7 g/dL — ABNORMAL LOW (ref 12.0–15.0)
MCH: 28.3 pg (ref 26.0–34.0)
MCHC: 31.3 g/dL (ref 30.0–36.0)
MCV: 90.6 fL (ref 78.0–100.0)
Platelets: 334 10*3/uL (ref 150–400)
RBC: 4.13 MIL/uL (ref 3.87–5.11)
RDW: 14.5 % (ref 11.5–15.5)
WBC: 7.4 10*3/uL (ref 4.0–10.5)

## 2017-01-21 LAB — BASIC METABOLIC PANEL
Anion gap: 9 (ref 5–15)
BUN: 16 mg/dL (ref 6–20)
CHLORIDE: 103 mmol/L (ref 101–111)
CO2: 24 mmol/L (ref 22–32)
Calcium: 9.1 mg/dL (ref 8.9–10.3)
Creatinine, Ser: 1.23 mg/dL — ABNORMAL HIGH (ref 0.44–1.00)
GFR calc Af Amer: 54 mL/min — ABNORMAL LOW (ref >60)
GFR, EST NON AFRICAN AMERICAN: 47 mL/min — AB (ref >60)
GLUCOSE: 90 mg/dL (ref 65–99)
POTASSIUM: 4.2 mmol/L (ref 3.5–5.1)
Sodium: 136 mmol/L (ref 135–145)

## 2017-01-21 LAB — URINALYSIS, ROUTINE W REFLEX MICROSCOPIC
BILIRUBIN URINE: NEGATIVE
GLUCOSE, UA: NEGATIVE mg/dL
HGB URINE DIPSTICK: NEGATIVE
Ketones, ur: NEGATIVE mg/dL
Leukocytes, UA: NEGATIVE
Nitrite: NEGATIVE
PH: 7 (ref 5.0–8.0)
Protein, ur: NEGATIVE mg/dL
SPECIFIC GRAVITY, URINE: 1.008 (ref 1.005–1.030)

## 2017-01-21 LAB — MAGNESIUM: Magnesium: 2.4 mg/dL (ref 1.7–2.4)

## 2017-01-21 LAB — I-STAT TROPONIN, ED: TROPONIN I, POC: 0 ng/mL (ref 0.00–0.08)

## 2017-01-21 LAB — D-DIMER, QUANTITATIVE (NOT AT ARMC): D DIMER QUANT: 1.89 ug{FEU}/mL — AB (ref 0.00–0.50)

## 2017-01-21 LAB — TROPONIN I: Troponin I: <0.03 ng/mL (ref <0.03)

## 2017-01-21 MED ORDER — ASPIRIN EC 81 MG PO TBEC
81.0000 mg | DELAYED_RELEASE_TABLET | Freq: Every day | ORAL | Status: DC
  Administered 2017-01-22: 81 mg via ORAL
  Filled 2017-01-21: qty 1

## 2017-01-21 MED ORDER — ASPIRIN 81 MG PO CHEW
324.0000 mg | CHEWABLE_TABLET | ORAL | Status: AC
  Administered 2017-01-21: 324 mg via ORAL
  Filled 2017-01-21: qty 4

## 2017-01-21 MED ORDER — PREDNISONE 5 MG PO TABS: 5.0000 mg | ORAL_TABLET | Freq: Every day | ORAL | Status: DC

## 2017-01-21 MED ORDER — ATENOLOL 25 MG PO TABS
25.0000 mg | ORAL_TABLET | Freq: Every day | ORAL | Status: DC
  Administered 2017-01-21 – 2017-01-22 (×2): 25 mg via ORAL
  Filled 2017-01-21 (×2): qty 1

## 2017-01-21 MED ORDER — HYDROCHLOROTHIAZIDE 25 MG PO TABS
25.0000 mg | ORAL_TABLET | Freq: Every day | ORAL | Status: DC
  Administered 2017-01-21 – 2017-01-22 (×2): 25 mg via ORAL
  Filled 2017-01-21 (×2): qty 1

## 2017-01-21 MED ORDER — SODIUM CHLORIDE 0.9 % IV BOLUS (SEPSIS)
1000.0000 mL | Freq: Once | INTRAVENOUS | Status: AC
  Administered 2017-01-21: 1000 mL via INTRAVENOUS

## 2017-01-21 MED ORDER — ACETAMINOPHEN 325 MG PO TABS: 650.0000 mg | ORAL_TABLET | ORAL | Status: DC | PRN

## 2017-01-21 MED ORDER — ASPIRIN 300 MG RE SUPP: 300.0000 mg | RECTAL | Status: AC

## 2017-01-21 MED ORDER — IOPAMIDOL (ISOVUE-370) INJECTION 76%
INTRAVENOUS | Status: AC
  Administered 2017-01-21: 100 mL
  Filled 2017-01-21: qty 100

## 2017-01-21 MED ORDER — ONDANSETRON HCL 4 MG/2ML IJ SOLN: 4.0000 mg | Freq: Four times a day (QID) | INTRAMUSCULAR | Status: DC | PRN

## 2017-01-21 MED ORDER — TIMOLOL HEMIHYDRATE 0.25 % OP SOLN
1.0000 [drp] | Freq: Two times a day (BID) | OPHTHALMIC | Status: DC
  Administered 2017-01-21 – 2017-01-22 (×2): 1 [drp] via OPHTHALMIC
  Filled 2017-01-21: qty 5

## 2017-01-21 NOTE — ED Triage Notes (Signed)
Pt states hx of palpitations. States at work she began having palpitations, then she was standing up at work and had a syncopal episode and fell into chair. Pt thinks she lost consciousness. No chest pain currently. States she feels weak now.

## 2017-01-21 NOTE — ED Notes (Signed)
On way to CT 

## 2017-01-21 NOTE — H&P (Signed)
History & Physical    Patient ID: TAELAR GRONEWOLD MRN: [id number], DOB/AGE: 07/17/1955   Admit date: 01/21/2017   Primary Physician: Maurice Small, MD Primary Cardiologist: New   Patient Profile    61 yo female with PMH of HTN who presented with palpitations and syncope.   Past Medical History   Past Medical History:  Diagnosis Date  . Hypertension     Past Surgical History:  Procedure Laterality Date  . ABDOMINAL HYSTERECTOMY    . CESAREAN SECTION    . REDUCTION MAMMAPLASTY Bilateral 2003     Allergies  Allergies  Allergen Reactions  . Combigan [Brimonidine Tartrate-Timolol] Other (See Comments)    lethargy    History of Present Illness    Mrs. Apsey is a 61 yo female with PMH of HTN and palpitations. Reports she has been seen for many years for palpitations and is currently on atenolol to help with this. Has been seen by a cardiologist in the past many years ago and had an echo done. 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 Denies any family hx of CAD, but mother/sister needing a PPM. States she presented to [facility] years ago with palpitations and was given medications to "slow" her heart, but has never been told about an specific arrhythmias.   Reports having been in her usual state of health until today. States she was standing next to her desk when she developed strong, forcefully palpitations. Then had a syncopal episode witnessed by her co-worker, that lasted only a couple of seconds. Reported having a squeezing sensation in her chest afterwards.   In the ED her labs showed stable electrolytes, trop neg x1, Hgb 11.7. Ddimer 1.89 but CTA was negative for PE. EKG showed SR with no acute ST/T waves abnormalities. No chest pain at the time of assessment.   Home Medications    Prior to Admission medications   Medication Sig Start Date End Date Taking? Authorizing Provider  atenolol (TENORMIN) 25 MG tablet Take 25 mg by mouth daily.    [provider]    hydrochlorothiazide (HYDRODIURIL) 25 MG tablet Take 1 tablet (25 mg total) by mouth daily. 07/22/12   Molpus, John, MD  meclizine (ANTIVERT) 25 MG tablet Take 1 tablet (25 mg total) by mouth 3 (three) times daily as needed for dizziness. 07/22/12   Molpus, John, MD    Family History    Family History  Problem Relation Age of Onset  . Arrhythmia Mother   . Arrhythmia Sister     Social History    Social History   Social History  . Marital status: Married    Spouse name: N/A  . Number of children: N/A  . Years of education: N/A   Occupational History  . Not on file.   Social History Main Topics  . Smoking status: Former Games developer  . Smokeless tobacco: Not on file  . Alcohol use No  . Drug use: No  . Sexual activity: Yes    Birth control/ protection: None   Other Topics Concern  . Not on file   Social History Narrative  . No narrative on file     Review of Systems    See HPI  All other systems reviewed and are otherwise negative except as noted above.  Physical Exam    Blood pressure (!) 144/99, pulse 64, temperature 97.9 F (36.6 C), temperature source Oral, resp. rate 16, height 5\' 4"  (1.626 m), weight 202 lb (91.6 kg), SpO2 100 %.  General: Pleasant, older AAF, NAD Psych: Normal affect. Neuro: Alert and oriented X 3. Moves all extremities spontaneously. HEENT: Normal  Neck: Supple without bruits or JVD. Lungs:  Resp regular and unlabored, CTA. Heart: RRR no s3, s4, soft systolic murmur. Abdomen: Soft, non-tender, non-distended, BS + x 4.  Extremities: No clubbing, cyanosis or edema. DP/PT/Radials 2+ and equal bilaterally.  Labs    Troponin [facility] of Care Test)  Recent Labs  01/21/17 0937  TROPIPOC 0.00   No results for input(s): CKTOTAL, CKMB, TROPONINI in the last 72 hours. Lab Results  Component Value Date   WBC 7.4 01/21/2017   HGB 11.7 (L) 01/21/2017   HCT 37.4 01/21/2017   MCV 90.6 01/21/2017   PLT 334 01/21/2017     Recent Labs Lab  01/21/17 0931  NA 136  K 4.2  CL 103  CO2 24  BUN 16  CREATININE 1.23*  CALCIUM 9.1  GLUCOSE 90   No results found for: CHOL, HDL, LDLCALC, TRIG Lab Results  Component Value Date   DDIMER 1.89 (H) 01/21/2017     Radiology Studies    Dg Chest 2 View  Result Date: 01/21/2017 CLINICAL DATA:  Onset of palpitations at work with subsequent syncopal episode falling into a chair. No current chest discomfort but the patient reports weakness. Remote history of smoking. History of hypertension. EXAM: CHEST  2 VIEW COMPARISON:  Chest x-ray of April 28, 2006 FINDINGS: The lungs are adequately inflated. There is no focal infiltrate. There is no pleural effusion. The heart and pulmonary vascularity are normal. There is calcification in the wall of the aortic arch. The bony thorax exhibits no acute abnormality. IMPRESSION: There is no acute cardiopulmonary abnormality. Thoracic aortic atherosclerosis. Electronically Signed   By: David  [place] M.D.   On: 01/21/2017 09:44   Ct Angio Chest Pe W And/or Wo Contrast  Result Date: 01/21/2017 CLINICAL DATA:  Shortness of breath, positive D-dimer EXAM: CT ANGIOGRAPHY CHEST WITH CONTRAST TECHNIQUE: Multidetector CT imaging of the chest was performed using the standard protocol during bolus administration of intravenous contrast. Multiplanar CT image reconstructions and MIPs were obtained to evaluate the vascular anatomy. CONTRAST:  100 cc Isovue 370 IV COMPARISON:  01/21/2017 FINDINGS: Cardiovascular: No filling defects in the pulmonary arteries to suggest pulmonary emboli. Heart is upper limits normal in size. Aorta is normal caliber. Mediastinum/Nodes: No mediastinal, hilar, or axillary adenopathy. Lungs/Pleura: Small subpleural nodule in the anterior right middle lobe on image 50 measures 5 mm. Lungs otherwise clear. No effusions. Upper Abdomen: Imaging into the upper abdomen shows no acute findings. Musculoskeletal: Chest wall soft tissues are unremarkable.  No acute bony abnormality. Review of the MIP images confirms the above findings. IMPRESSION: No evidence of pulmonary embolus. 5 mm anterior right middle lobe nodule. No follow-up needed if patient is low-risk. Non-contrast chest CT can be considered in 12 months if patient is high-risk. This recommendation follows the consensus statement: Guidelines for Management of Incidental Pulmonary Nodules Detected on CT Images: From the Fleischner Society 2017; Radiology 2017; 284:228-243. No acute cardiopulmonary disease. Electronically Signed   By: Charlett Nose M.D.   On: 01/21/2017 13:20    ECG & Cardiac Imaging    EKG: SR with no acute ST/T wave changes.   Assessment & Plan    61 yo female with PMH of HTN who presented with palpitations and syncope.   1. Syncope: reports having strong, forceful palpitations prior to this episode, then had a squeezing sensation in her chest afterwards.  No abnormalities noted on EKG. Initial trop negative. Ddimer was positive but CTA negative.  -- admit to telemetry -- cycle troponins -- check echo -- consider outpatient monitor if nothing noted on telemetry while inpatient  2. Palpitations: continue atenolol -- observe on telemetry  3. HTN: Reports being started on HCTZ as an outpatient 2/2 uncontrolled BP -- will continue home medications, may need to adjust for better controll  4. Elevated Cr: could be 2/2 to HCTZ -- recheck BMET in the am  Signed, Laverda Page, NP-C Pager [phone] 01/21/2017, 2:24 PM

## 2017-01-21 NOTE — ED Notes (Signed)
Dinner tray   (heart healthy) ordered for patient.

## 2017-01-21 NOTE — ED Provider Notes (Signed)
[facility]-EMERGENCY DEPT Provider Note   CSN: [id number] Arrival date & time: 01/21/17  0913     History   Chief Complaint Chief Complaint  Patient presents with  . Palpitations  . Weakness  . Loss of Consciousness    HPI Marissa Torres is a 61 y.o. female.  The history is provided by the patient, medical records and the spouse. No language interpreter was used.  Loss of Consciousness   This is a new problem. The current episode started less than 1 hour ago. The problem occurs constantly. The problem has been resolved. She lost consciousness for a period of less than one minute. The problem is associated with normal activity. Associated symptoms include chest pain, malaise/fatigue and palpitations. Pertinent negatives include abdominal pain, back pain, confusion, congestion, diaphoresis, dizziness, fever, focal weakness, headaches, light-headedness, nausea, seizures, vertigo, vomiting and weakness. She has tried nothing for the symptoms. The treatment provided no relief.    Past Medical History:  Diagnosis Date  . Hypertension     There are no active problems to display for this patient.   Past Surgical History:  Procedure Laterality Date  . ABDOMINAL HYSTERECTOMY    . CESAREAN SECTION    . REDUCTION MAMMAPLASTY Bilateral 2003    OB History    No data available       Home Medications    Prior to Admission medications   Medication Sig Start Date End Date Taking? Authorizing Provider  atenolol (TENORMIN) 25 MG tablet Take 25 mg by mouth daily.    [provider]  hydrochlorothiazide (HYDRODIURIL) 25 MG tablet Take 1 tablet (25 mg total) by mouth daily. 07/22/12   Molpus, John, MD  meclizine (ANTIVERT) 25 MG tablet Take 1 tablet (25 mg total) by mouth 3 (three) times daily as needed for dizziness. 07/22/12   Molpus, John, MD    Family History No family history on file.  Social History Social History  Substance Use Topics  . Smoking status: Former Games developer    . Smokeless tobacco: Not on file  . Alcohol use No     Allergies   Patient has no known allergies.   Review of Systems Review of Systems  Constitutional: Positive for fatigue and malaise/fatigue. Negative for chills, diaphoresis and fever.  HENT: Negative for congestion.   Eyes: Negative for visual disturbance.  Respiratory: Positive for shortness of breath. Negative for chest tightness.   Cardiovascular: Positive for chest pain, palpitations and syncope. Negative for leg swelling.  Gastrointestinal: Negative for abdominal pain, constipation, diarrhea, nausea and vomiting.  Genitourinary: Negative for dysuria.  Musculoskeletal: Negative for back pain.  Neurological: Positive for syncope. Negative for dizziness, vertigo, focal weakness, seizures, facial asymmetry, weakness, light-headedness and headaches.  Psychiatric/Behavioral: Negative for agitation and confusion.  All other systems reviewed and are negative.    Physical Exam Updated Vital Signs BP (!) 179/108   Pulse 86   Temp 97.9 F (36.6 C) (Oral)   Resp 18   Ht  (1.626 m)   Wt 91.6 kg (202 lb)   SpO2 100%   BMI 34.67 kg/m   Physical Exam  Constitutional: She appears well-developed and well-nourished. No distress.  HENT:  Head: Normocephalic.  Mouth/Throat: Oropharynx is clear and moist. No oropharyngeal exudate.  Eyes: Pupils are equal, round, and reactive to light. Conjunctivae and EOM are normal.  Neck: Normal range of motion.  Cardiovascular: Normal rate and intact distal pulses.   No murmur heard. Pulmonary/Chest: Effort normal and breath sounds normal.  No stridor. No respiratory distress. She has no wheezes. She exhibits no tenderness.  Abdominal: Soft. There is no tenderness.  Musculoskeletal: She exhibits no tenderness.  Neurological: No sensory deficit. She exhibits normal muscle tone.  Skin: Capillary refill takes less than 2 seconds. No rash noted. She is not diaphoretic. No erythema.   Psychiatric: She has a normal mood and affect.  Nursing note and vitals reviewed.    ED Treatments / Results  Labs (all labs ordered are listed, but only abnormal results are displayed) Labs Reviewed  BASIC METABOLIC PANEL - Abnormal; Notable for the following:       Result Value   Creatinine, Ser 1.23 (*)    GFR calc non Af Amer 47 (*)    GFR calc Af Amer 54 (*)    All other components within normal limits  CBC - Abnormal; Notable for the following:    Hemoglobin 11.7 (*)    All other components within normal limits  URINALYSIS, ROUTINE W REFLEX MICROSCOPIC - Abnormal; Notable for the following:    Color, Urine STRAW (*)    All other components within normal limits  D-DIMER, QUANTITATIVE (NOT AT [facility]) - Abnormal; Notable for the following:    D-Dimer, Quant 1.89 (*)    All other components within normal limits  MAGNESIUM  I-STAT TROPONIN, ED    EKG  EKG Interpretation  Date/Time:  Wednesday January 21 2017 09:24:56 EDT Ventricular Rate:  86 PR Interval:  156 QRS Duration: 76 QT Interval:  336 QTC Calculation: 402 R Axis:   19 Text Interpretation:  Normal sinus rhythm Low voltage QRS Inferior infarct , age undetermined Cannot rule out Anterior infarct , age undetermined Abnormal ECG When comapred to prior, no significant changes seen.  No STEMI Confirmed by Theda Belfast ([id number]) on 01/21/2017 11:08:29 AM       Radiology Dg Chest 2 View  Result Date: 01/21/2017 CLINICAL DATA:  Onset of palpitations at work with subsequent syncopal episode falling into a chair. No current chest discomfort but the patient reports weakness. Remote history of smoking. History of hypertension. EXAM: CHEST  2 VIEW COMPARISON:  Chest x-ray of April 28, 2006 FINDINGS: The lungs are adequately inflated. There is no focal infiltrate. There is no pleural effusion. The heart and pulmonary vascularity are normal. There is calcification in the wall of the aortic arch. The bony thorax exhibits no  acute abnormality. IMPRESSION: There is no acute cardiopulmonary abnormality. Thoracic aortic atherosclerosis. Electronically Signed   By: David  [place] M.D.   On: 01/21/2017 09:44    Procedures Procedures (including critical care time)  Medications Ordered in ED Medications  sodium chloride 0.9 % bolus 1,000 mL (0 mLs Intravenous Stopped 01/21/17 1321)  iopamidol (ISOVUE-370) 76 % injection (100 mLs  Contrast Given 01/21/17 1258)     Initial Impression / Assessment and Plan / ED Course  I have reviewed the triage vital signs and the nursing notes.  Pertinent labs & imaging results that were available during my care of the patient were reviewed by me and considered in my medical decision making (see chart for details).     Marissa Torres is a 61 y.o. female with a past medical history significant for hypertension and chronic palpitations who presents with chest tightness, worsening palpitations, and syncope at work. Patient reports that she has had palpitations "all her life". She says it has been years and she has seen a cardiologist. She reports that she has a strong family history of  heart disease with a mother having a large heart, brother who had a pacemaker and passed away and a sister who has had multiple heart surgeries. Patient reports that she is on atenolol and HCTZ daily. She reports that although she has had chronic palpitations, she has never had chest pain and syncopal episodes with it. She says that years ago she had to go to [facility] and had to receive an injection medicine to get her heart rate slowed down, suspect SVT or V. tach based on description. Patient says that she had sudden onset of heart palpitations, chest tightness, and then she woke up after a syncopal episode. Patient does report loss of consciousness. She reports feeling fatigued after the episode. She reports feeling intermittent palpitations but denies current chest pain. She reports that she had shortness  of breath following the episode. She describes the tightness is a 4/10 in severity. She denies recent traumas. She denies new medicines. She reports staying hydrated with normal oral intake. She does report some fatigue but denies any fevers, chills, congestion, cough, nausea, vomiting, conservation, diarrhea, or any urinary symptoms.  On exam, patient had symmetric radial pulses. Regular rhythm. Lungs were clear chest is nontender. Very mild systolic murmur appreciated. Abdomen nontender. Legs not edematous. No crackles in lungs.  Based on description of symptoms, suspect arrhythmia leading to syncopal episode. Patient is on telemetry monitoring currently. Patient will have laboratory testing to look for occult infection, dehydration, or electrolyte abnormalities that may contribute. Anticipate speaking with cardiology to determine disposition.  1:36 PM Patient had d-dimer that was elevated. CT PE study was ordered. CT scan showed no evidence of pulmonary embolism. Small lung nodule was seen. Patient can follow with PCP for further monitoring of the nodule.  Patient's troponin was negative. Electrolytes grossly unremarkable. Slight increase in creatinine up to 1.23 however, no recent values to compare. Mild anemia of 11.7.  Based on the story, there is clinical concern for a cardiac etiology of the syncope with preceding chest pain and palpitations. Cardiology will be called for recommendations.  Cardiology will Admit the patient for observation and further management.   Final Clinical Impressions(s) / ED Diagnoses   Final diagnoses:  Syncope, unspecified syncope type  Palpitations  Shortness of breath  Chest tightness    New Prescriptions New Prescriptions   No medications on file    Clinical Impression: 1. Syncope, unspecified syncope type   2. Palpitations   3. Shortness of breath   4. Chest tightness     Disposition: Admit to Cardiology    Curties Conigliaro, Canary Brim,  MD 01/21/17 [phone]

## 2017-01-22 ENCOUNTER — Other Ambulatory Visit (HOSPITAL_COMMUNITY): Payer: BC Managed Care – PPO

## 2017-01-22 ENCOUNTER — Encounter (HOSPITAL_COMMUNITY): Payer: Self-pay

## 2017-01-22 ENCOUNTER — Other Ambulatory Visit: Payer: Self-pay | Admitting: Cardiology

## 2017-01-22 ENCOUNTER — Observation Stay (HOSPITAL_BASED_OUTPATIENT_CLINIC_OR_DEPARTMENT_OTHER): Payer: BC Managed Care – PPO

## 2017-01-22 DIAGNOSIS — I361 Nonrheumatic tricuspid (valve) insufficiency: Secondary | ICD-10-CM

## 2017-01-22 DIAGNOSIS — R002 Palpitations: Secondary | ICD-10-CM | POA: Diagnosis not present

## 2017-01-22 DIAGNOSIS — R55 Syncope and collapse: Secondary | ICD-10-CM | POA: Diagnosis not present

## 2017-01-22 LAB — BASIC METABOLIC PANEL
ANION GAP: 9 (ref 5–15)
BUN: 19 mg/dL (ref 6–20)
CALCIUM: 8.9 mg/dL (ref 8.9–10.3)
CO2: 24 mmol/L (ref 22–32)
CREATININE: 1.26 mg/dL — AB (ref 0.44–1.00)
Chloride: 104 mmol/L (ref 101–111)
GFR, EST AFRICAN AMERICAN: 53 mL/min — AB (ref >60)
GFR, EST NON AFRICAN AMERICAN: 45 mL/min — AB (ref >60)
Glucose, Bld: 85 mg/dL (ref 65–99)
Potassium: 4 mmol/L (ref 3.5–5.1)
SODIUM: 137 mmol/L (ref 135–145)

## 2017-01-22 LAB — HIV ANTIBODY (ROUTINE TESTING W REFLEX): HIV SCREEN 4TH GENERATION: NONREACTIVE

## 2017-01-22 LAB — TROPONIN I: Troponin I: <0.03 ng/mL (ref <0.03)

## 2017-01-22 LAB — ECHOCARDIOGRAM COMPLETE
Height: 64 in
Weight: 3217.6 oz

## 2017-01-22 MED ORDER — ATENOLOL 25 MG PO TABS: 25.0000 mg | ORAL_TABLET | Freq: Every day | ORAL | 1 refills | Status: DC

## 2017-01-22 NOTE — Progress Notes (Signed)
Progress Note  Patient Name: Marissa Torres Date of Encounter: 01/22/2017  Primary Cardiologist: Allyson Sabal  Subjective   No CP/SOB or syncope overnight.  Inpatient Medications    Scheduled Meds: . aspirin EC  81 mg Oral Daily  . atenolol  25 mg Oral Daily  . hydrochlorothiazide  25 mg Oral Daily  . predniSONE  5 mg Oral Q breakfast  . timolol  1 drop Both Eyes BID   Continuous Infusions:  PRN Meds: acetaminophen, ondansetron (ZOFRAN) IV   Vital Signs    Vitals:   01/21/17 1845 01/21/17 1959 01/21/17 2006 01/22/17 0509  BP: 111/77  (!) 141/85 105/67  Pulse: 63  69 63  Resp: 13  13   Temp:   97.7 F (36.5 C) (!) 97.5 F (36.4 C)  TempSrc:   Oral Oral  SpO2: 100%  100% 100%  Weight:  200 lb 14.4 oz (91.1 kg)  201 lb 1.6 oz (91.2 kg)  Height:  5\' 4"  (1.626 m)      Intake/Output Summary (Last 24 hours) at 01/22/17 0935 Last data filed at 01/22/17 0733  Gross per 24 hour  Intake             1000 ml  Output             1050 ml  Net              -50 ml   Filed Weights   01/21/17 0922 01/21/17 1959 01/22/17 0509  Weight: 202 lb (91.6 kg) 200 lb 14.4 oz (91.1 kg) 201 lb 1.6 oz (91.2 kg)    Telemetry    NSR/SB - Personally Reviewed  ECG    NOt performed - Personally Reviewed  Physical Exam   GEN: No acute distress.   Neck: No JVD Cardiac: RRR, no murmurs, rubs, or gallops.  Respiratory: Clear to auscultation bilaterally. GI: Soft, nontender, non-distended  MS: No edema; No deformity. Neuro:  Nonfocal  Psych: Normal affect   Labs    Chemistry Recent Labs Lab 01/21/17 0931 01/22/17 0202  NA 136 137  K 4.2 4.0  CL 103 104  CO2 24 24  GLUCOSE 90 85  BUN 16 19  CREATININE 1.23* 1.26*  CALCIUM 9.1 8.9  GFRNONAA 47* 45*  GFRAA 54* 53*  ANIONGAP 9 9     Hematology Recent Labs Lab 01/21/17 0931  WBC 7.4  RBC 4.13  HGB 11.7*  HCT 37.4  MCV 90.6  MCH 28.3  MCHC 31.3  RDW 14.5  PLT 334    Cardiac Enzymes Recent Labs Lab  01/21/17 2031 01/22/17 0202  TROPONINI <0.03 <0.03    Recent Labs Lab 01/21/17 0937  TROPIPOC 0.00     BNPNo results for input(s): BNP, PROBNP in the last 168 hours.   DDimer  Recent Labs Lab 01/21/17 0931  DDIMER 1.89*     Radiology    Dg Chest 2 View  Result Date: 01/21/2017 CLINICAL DATA:  Onset of palpitations at work with subsequent syncopal episode falling into a chair. No current chest discomfort but the patient reports weakness. Remote history of smoking. History of hypertension. EXAM: CHEST  2 VIEW COMPARISON:  Chest x-ray of April 28, 2006 FINDINGS: The lungs are adequately inflated. There is no focal infiltrate. There is no pleural effusion. The heart and pulmonary vascularity are normal. There is calcification in the wall of the aortic arch. The bony thorax exhibits no acute abnormality. IMPRESSION: There is no acute cardiopulmonary abnormality. Thoracic aortic  atherosclerosis. Electronically Signed   By: David  [place] M.D.   On: 01/21/2017 09:44   Ct Angio Chest Pe W And/or Wo Contrast  Result Date: 01/21/2017 CLINICAL DATA:  Shortness of breath, positive D-dimer EXAM: CT ANGIOGRAPHY CHEST WITH CONTRAST TECHNIQUE: Multidetector CT imaging of the chest was performed using the standard protocol during bolus administration of intravenous contrast. Multiplanar CT image reconstructions and MIPs were obtained to evaluate the vascular anatomy. CONTRAST:  100 cc Isovue 370 IV COMPARISON:  01/21/2017 FINDINGS: Cardiovascular: No filling defects in the pulmonary arteries to suggest pulmonary emboli. Heart is upper limits normal in size. Aorta is normal caliber. Mediastinum/Nodes: No mediastinal, hilar, or axillary adenopathy. Lungs/Pleura: Small subpleural nodule in the anterior right middle lobe on image 50 measures 5 mm. Lungs otherwise clear. No effusions. Upper Abdomen: Imaging into the upper abdomen shows no acute findings. Musculoskeletal: Chest wall soft tissues are  unremarkable. No acute bony abnormality. Review of the MIP images confirms the above findings. IMPRESSION: No evidence of pulmonary embolus. 5 mm anterior right middle lobe nodule. No follow-up needed if patient is low-risk. Non-contrast chest CT can be considered in 12 months if patient is high-risk. This recommendation follows the consensus statement: Guidelines for Management of Incidental Pulmonary Nodules Detected on CT Images: From the Fleischner Society 2017; Radiology 2017; 284:228-243. No acute cardiopulmonary disease. Electronically Signed   By: Charlett Nose M.D.   On: 01/21/2017 13:20    Cardiac Studies   None yet  Patient Profile     61 y.o. female admitted with syncope. H/O palp on BB as OP. CTA nl. Tele nl overnight  Assessment & Plan    1. Syncope- Stable overnight. Tele benign. 2D pending. Labs OK. Exam benign. CTA neg. OK for DC home today after 2D. WIll fit with OP 30 dayevent monitor , ROV 6 weeks   For questions or updates, please contact [facility] Please consult [email or web address] for contact info under Cardiology/STEMI.      Alphonsus Sias, MD  01/22/2017, 9:35 AM

## 2017-01-22 NOTE — Discharge Instructions (Signed)
Syncope Syncope is when you lose temporarily pass out (faint). Signs that you may be about to pass out include:  Feeling dizzy or light-headed.  Feeling sick to your stomach (nauseous).  Seeing all white or all black.  Having cold, clammy skin.  If you passed out, get help right away. Call your local emergency services (911 in the [place] Do not drive yourself to the hospital. Follow these instructions at home: Pay attention to any changes in your symptoms. Take these actions to help with your condition:  Have someone stay with you until you feel stable.  Do not drive, use machinery, or play sports until your doctor says it is okay.  Keep all follow-up visits as told by your doctor. This is important.  If you start to feel like you might pass out, lie down right away and raise (elevate) your feet above the level of your heart. Breathe deeply and steadily. Wait until all of the symptoms are gone.  Drink enough fluid to keep your pee (urine) clear or pale yellow.  If you are taking blood pressure or heart medicine, get up slowly and spend many minutes getting ready to sit and then stand. This can help with dizziness.  Take over-the-counter and prescription medicines only as told by your doctor.  Get help right away if:  You have a very bad headache.  You have unusual pain in your chest, tummy, or back.  You are bleeding from your mouth or rectum.  You have black or tarry poop (stool).  You have a very fast or uneven heartbeat (palpitations).  It hurts to breathe.  You pass out once or more than once.  You have jerky movements that you cannot control (seizure).  You are confused.  You have trouble walking.  You are very weak.  You have vision problems. These symptoms may be an emergency. Do not wait to see if the symptoms will go away. Get medical help right away. Call your local emergency services (911 in the [place] Do not drive yourself to the hospital. This  information is not intended to replace advice given to you by your health care provider. Make sure you discuss any questions you have with your health care provider. Document Released: 09/17/2007 Document Revised: 09/06/2015 Document Reviewed: 12/13/2014 Elsevier Interactive Patient Education  2018 Elsevier Inc.  

## 2017-01-22 NOTE — Progress Notes (Signed)
  Echocardiogram 2D Echocardiogram has been performed.  Marissa Torres 01/22/2017, 2:26 PM

## 2017-01-22 NOTE — Plan of Care (Signed)
Problem: Pain Managment: Goal: General experience of comfort will improve Outcome: Progressing Pt denies pain   

## 2017-01-22 NOTE — Discharge Summary (Signed)
Discharge Summary    Patient ID: Marissa Torres,  MRN: [id number], DOB/AGE: 61-May-1957 61 y.o.  Admit date: 01/21/2017 Discharge date: 01/22/2017  Primary Care Provider: Maurice Small Primary Cardiologist: Allyson Sabal   Discharge Diagnoses    Active Problems:   Syncope   Palpitation   Essential hypertension   Allergies Allergies  Allergen Reactions  . Combigan [Brimonidine Tartrate-Timolol] Other (See Comments)    lethargy    Diagnostic Studies/Procedures    TTE: 01/22/17  Study Conclusions  - Left ventricle: The cavity size was normal. Wall thickness was   increased in a pattern of moderate LVH. Systolic function was   normal. The estimated ejection fraction was in the range of 60%   to 65%. Wall motion was normal; there were no regional wall   motion abnormalities. Doppler parameters are consistent with   abnormal left ventricular relaxation (grade 1 diastolic   dysfunction). The E/e&' ratio is between 8-15, suggesting   indeterminate LV filling pressure. - Aortic valve: Trileaflet. Sclerosis without stenosis. There was   no regurgitation. - Mitral valve: Calcified annulus. Mildly thickened leaflets .   There was trivial regurgitation. - Tricuspid valve: There was mild regurgitation. - Pulmonary arteries: PA peak pressure: 46 mm Hg (S). - Inferior vena cava: The vessel was normal in size. The   respirophasic diameter changes were in the normal range (= 50%),   consistent with normal central venous pressure.  Impressions:  - LVEF 60-65%, moderate LVH, normal wall motion, grade 1 DD,   indeterminate LV filling pressure, aortic valve sclerosis,   trivial MR, mild TR, RVSP 46 mmHg, normal IVC. _____________   History of Present Illness     Marissa Torres is a 61 yo female with PMH of HTN and palpitations. Reports she has been seen for many years for palpitations and is currently on atenolol to help with this. Has been seen by a cardiologist in the past many  years ago and had an echo done. 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 Denies any family hx of CAD, but mother/sister needing a PPM. States she presented to [facility] years ago with palpitations and was given medications to "slow" her heart, but has never been told about an specific arrhythmias.   Reports having been in her usual state of health until the day of admissio. Stated she was standing next to her desk when she developed strong, forcefully palpitations. Then had a syncopal episode witnessed by her co-worker, that lasted only a couple of seconds. Reported having a squeezing sensation in her chest afterwards.   In the ED her labs showed stable electrolytes, trop neg x1, Hgb 11.7. Ddimer 1.89 but CTA was negative for PE. EKG showed SR with no acute ST/T waves abnormalities. No chest pain at the time of assessment. She was admitted for further work up.  Hospital Course   Troponins cycled and negative x3. No concerning arrhythmias noted on telemetry. Echo showed normal EF with no significant valvular abnormalities. Her home atenolol on admission was noted as 38m daily, but patient reported she had been taking 100mg  daily prior to admission. Telemetry noted her HR in the 50s, therefore dose was continued at 25mg  daily. She was also on home dose of HCTZ. She will be set up for an outpatient cardiac monitor. Will have her continue atenolol at 25mg  and follow her blood pressure and HR at home.   Marissa Torres was seen by Dr. Allyson Sabal and determined stable for discharge home. Follow  up in the office has been arranged. Medications are listed below.   _____________  Discharge Vitals Blood pressure 105/67, pulse 63, temperature (!) 97.5 F (36.4 C), temperature source Oral, resp. rate 13, height 5\' 4"  (1.626 m), weight 201 lb 1.6 oz (91.2 kg), SpO2 100 %.  Filed Weights   01/21/17 0922 01/21/17 1959 01/22/17 0509  Weight: 202 lb (91.6 kg) 200 lb 14.4 oz (91.1 kg) 201 lb 1.6 oz (91.2 kg)    Labs &  Radiologic Studies    CBC  Recent Labs  01/21/17 0931  WBC 7.4  HGB 11.7*  HCT 37.4  MCV 90.6  PLT 334   Basic Metabolic Panel  Recent Labs  01/21/17 0931 01/22/17 0202  NA 136 137  K 4.2 4.0  CL 103 104  CO2 24 24  GLUCOSE 90 85  BUN 16 19  CREATININE 1.23* 1.26*  CALCIUM 9.1 8.9  MG 2.4  --    Liver Function Tests No results for input(s): AST, ALT, ALKPHOS, BILITOT, PROT, ALBUMIN in the last 72 hours. No results for input(s): LIPASE, AMYLASE in the last 72 hours. Cardiac Enzymes  Recent Labs  01/21/17 2031 01/22/17 0202 01/22/17 0832  TROPONINI <0.03 <0.03 <0.03   BNP Invalid input(s): POCBNP D-Dimer  Recent Labs  01/21/17 0931  DDIMER 1.89*   Hemoglobin A1C No results for input(s): HGBA1C in the last 72 hours. Fasting Lipid Panel No results for input(s): CHOL, HDL, LDLCALC, TRIG, CHOLHDL, LDLDIRECT in the last 72 hours. Thyroid Function Tests No results for input(s): TSH, T4TOTAL, T3FREE, THYROIDAB in the last 72 hours.  Invalid input(s): FREET3 _____________  Dg Chest 2 View  Result Date: 01/21/2017 CLINICAL DATA:  Onset of palpitations at work with subsequent syncopal episode falling into a chair. No current chest discomfort but the patient reports weakness. Remote history of smoking. History of hypertension. EXAM: CHEST  2 VIEW COMPARISON:  Chest x-ray of April 28, 2006 FINDINGS: The lungs are adequately inflated. There is no focal infiltrate. There is no pleural effusion. The heart and pulmonary vascularity are normal. There is calcification in the wall of the aortic arch. The bony thorax exhibits no acute abnormality. IMPRESSION: There is no acute cardiopulmonary abnormality. Thoracic aortic atherosclerosis. Electronically Signed   By: David  April 30, 2006 M.D.   On: 01/21/2017 09:44   Ct Angio Chest Pe W And/or Wo Contrast  Result Date: 01/21/2017 CLINICAL DATA:  Shortness of breath, positive D-dimer EXAM: CT ANGIOGRAPHY CHEST WITH CONTRAST  TECHNIQUE: Multidetector CT imaging of the chest was performed using the standard protocol during bolus administration of intravenous contrast. Multiplanar CT image reconstructions and MIPs were obtained to evaluate the vascular anatomy. CONTRAST:  100 cc Isovue 370 IV COMPARISON:  01/21/2017 FINDINGS: Cardiovascular: No filling defects in the pulmonary arteries to suggest pulmonary emboli. Heart is upper limits normal in size. Aorta is normal caliber. Mediastinum/Nodes: No mediastinal, hilar, or axillary adenopathy. Lungs/Pleura: Small subpleural nodule in the anterior right middle lobe on image 50 measures 5 mm. Lungs otherwise clear. No effusions. Upper Abdomen: Imaging into the upper abdomen shows no acute findings. Musculoskeletal: Chest wall soft tissues are unremarkable. No acute bony abnormality. Review of the MIP images confirms the above findings. IMPRESSION: No evidence of pulmonary embolus. 5 mm anterior right middle lobe nodule. No follow-up needed if patient is low-risk. Non-contrast chest CT can be considered in 12 months if patient is high-risk. This recommendation follows the consensus statement: Guidelines for Management of Incidental Pulmonary Nodules Detected on CT  Images: From the Fleischner Society 2017; Radiology 2017; [phone]. No acute cardiopulmonary disease. Electronically Signed   By: Charlett Nose M.D.   On: 01/21/2017 13:20   Disposition   Pt is being discharged home today in good condition.  Follow-up Plans & Appointments    Follow-up Information    Runell Gess, MD Follow up on 03/10/2017.   Specialties:  Cardiology, Radiology Why:  at 10:45am for your follow up appt.  Contact information: [address] [place] [address] [phone]        [facility] Follow up.   Why:  The office will call you to arrange you monitor fitting.  Contact information: [address] [place] [address] [phone]         Discharge Instructions    Diet - low sodium heart healthy    Complete by:  As directed    Discharge instructions    Complete by:  As directed    We reduced your atenolol dose back to 25mg  daily as your heart rate was in the 50s. Please keep a close check on your blood pressure to ensure that it is not uncontrolled at home with this change. The office will call you to fit your cardiac monitor, and you have a follow up arranged with Dr. in several weeks. Please call with any questions.   Increase activity slowly    Complete by:  As directed       Discharge Medications     Medication List    TAKE these medications   atenolol 25 MG tablet Commonly known as:  TENORMIN Take 25 mg by mouth daily. What changed:  Another medication with the same name was removed. Continue taking this medication, and follow the directions you see here.   brinzolamide 1 % ophthalmic suspension Commonly known as:  AZOPT Place 1 drop into both eyes 2 (two) times daily.   folic acid 1 MG tablet Commonly known as:  FOLVITE Take 2-3 mg by mouth See admin instructions. 2mg  Mon, Tues, Wed, Su, 3mg  Thurs, Fri, Sat   hydrochlorothiazide 25 MG tablet Commonly known as:  HYDRODIURIL Take 1 tablet (25 mg total) by mouth daily. What changed:  how much to take   meloxicam 15 MG tablet Commonly known as:  MOBIC Take 15 mg by mouth daily.   methotrexate 2.5 MG tablet Commonly known as:  RHEUMATREX Take 17.5 mg by mouth once a week.   predniSONE 5 MG tablet Commonly known as:  DELTASONE Take 5 mg by mouth daily with breakfast.   timolol 0.25 % ophthalmic solution Commonly known as:  BETIMOL Place 1 drop into both eyes 2 (two) times daily.        Outstanding Labs/Studies   Outpt cardiac monitor  Duration of Discharge Encounter   Greater than 30 minutes including physician time.  Signed, 06-19-2000 NP-C 01/22/2017, 3:05 PM  Agree  with note by Thu NP-C  Patient admitted with syncope. Her telemetry was unremarkable. Her 2-D echo was normal. Platelets were discharged home this morning with 30 day event monitor placed as an outpatient. I will see her back in 6 weeks.   Laverda Page, M.D., FACP, [facility], Laverda Page [facility] [address] [place], [facility]  [address]  [phone] 01/23/2017 7:35 AM

## 2017-02-09 ENCOUNTER — Other Ambulatory Visit: Payer: Self-pay | Admitting: Cardiology

## 2017-02-09 ENCOUNTER — Ambulatory Visit (INDEPENDENT_AMBULATORY_CARE_PROVIDER_SITE_OTHER): Payer: BC Managed Care – PPO

## 2017-02-09 DIAGNOSIS — R55 Syncope and collapse: Secondary | ICD-10-CM

## 2017-02-09 DIAGNOSIS — R002 Palpitations: Secondary | ICD-10-CM

## 2017-03-09 ENCOUNTER — Telehealth: Payer: Self-pay | Admitting: Cardiovascular Disease

## 2017-03-09 NOTE — Telephone Encounter (Signed)
Patient calling, states that she was supposed to have heart monitor for 30 days but patient only received monitor 02-09-17 so it hasnt been 30 days. Patient would like to know if she should r/s appt.

## 2017-03-09 NOTE — Telephone Encounter (Signed)
Pt needs a return visit reschedule in order to have time to review 30 day event monitor. Please call to arrange a return visit on Dr. Hazle Coca calendar in ~2 weeks .

## 2017-03-10 ENCOUNTER — Ambulatory Visit: Payer: BC Managed Care – PPO | Admitting: Cardiovascular Disease

## 2017-03-10 ENCOUNTER — Encounter: Payer: Self-pay | Admitting: Cardiovascular Disease

## 2017-03-10 DIAGNOSIS — I10 Essential (primary) hypertension: Secondary | ICD-10-CM | POA: Diagnosis not present

## 2017-03-10 DIAGNOSIS — R002 Palpitations: Secondary | ICD-10-CM | POA: Diagnosis not present

## 2017-03-10 NOTE — Assessment & Plan Note (Signed)
Marissa Torres  had an episode of syncope back in October which resulted in a one-day hospitalization. She also had chest pain at the time. Her enzymes were negative. A 2-D echo was essentially normal. She was wearing an event monitor that showed sinus rhythm with sudden onset of heart rates in the 150 range suggesting PSVT versus atrial flutter with 2-1 response.I'm going to get a exercise Myoview stress test to rule out ischemic etiology and will refer her to one of my electrophysiology colleagues for further evaluation.

## 2017-03-10 NOTE — Progress Notes (Signed)
03/10/2017 Marissa Torres   1956/01/17  [id number]  Primary Physician Maurice Small, MD Primary Cardiologist: Runell Gess MD Nicholes Calam[place], [place]  HPI:  Marissa Torres is a 61 y.o. female African-American who was admitted to [facility] on 01/22/17 overnight for palpitations, syncope and chest pain. She has no other cardiovascular risk factors. She did have a 2-D echo which was normal and a recent monitor that showed sinus rhythm with episodes of heart rates in the 150 range at rest suggesting a PSVT versus a- flutter with 2: 1 conduction. These episodes are also associated with weakness, tiredness and chest fullness.   Current Meds  Medication Sig  . atenolol (TENORMIN) 25 MG tablet Take 1 tablet (25 mg total) by mouth daily.  . brinzolamide (AZOPT) 1 % ophthalmic suspension Place 1 drop into both eyes 2 (two) times daily.  . folic acid (FOLVITE) 1 MG tablet Take 2-3 mg by mouth See admin instructions. 2mg  Mon, Tues, Wed, Su, 3mg  Thurs, Fri, Sat  . hydrochlorothiazide (HYDRODIURIL) 25 MG tablet Take 1 tablet (25 mg total) by mouth daily. (Patient taking differently: Take 12.5 mg by mouth daily. )  . meloxicam (MOBIC) 15 MG tablet Take 15 mg by mouth daily.  . methotrexate (RHEUMATREX) 2.5 MG tablet Take 17.5 mg by mouth once a week.  . Naproxen Sodium (ALEVE) 220 MG CAPS Take 2 capsules by mouth daily.  . predniSONE (DELTASONE) 5 MG tablet Take 5 mg by mouth daily with breakfast.  . timolol (BETIMOL) 0.25 % ophthalmic solution Place 1 drop into both eyes 2 (two) times daily.     Allergies  Allergen Reactions  . Combigan [Brimonidine Tartrate-Timolol] Other (See Comments)    lethargy    Social History   Socioeconomic History  . Marital status: Married    Spouse name: Not on file  . Number of children: Not on file  . Years of education: Not on file  . Highest education level: Not on file  Social Needs  . Financial resource strain: Not on file  . Food  insecurity - worry: Not on file  . Food insecurity - inability: Not on file  . Transportation needs - medical: Not on file  . Transportation needs - non-medical: Not on file  Occupational History  . Not on file  Tobacco Use  . Smoking status: Former Sat  . Smokeless tobacco: Never Used  Substance and Sexual Activity  . Alcohol use: No  . Drug use: No  . Sexual activity: Yes    Birth control/protection: None  Other Topics Concern  . Not on file  Social History Narrative  . Not on file     Review of Systems: General: negative for chills, fever, night sweats or weight changes.  Cardiovascular: negative for chest pain, dyspnea on exertion, edema, orthopnea, palpitations, paroxysmal nocturnal dyspnea or shortness of breath Dermatological: negative for rash Respiratory: negative for cough or wheezing Urologic: negative for hematuria Abdominal: negative for nausea, vomiting, diarrhea, bright red blood per rectum, melena, or hematemesis Neurologic: negative for visual changes, syncope, or dizziness All other systems reviewed and are otherwise negative except as noted above.    Blood pressure (!) 151/95, pulse 67, height 5\' 4"  (1.626 m), weight 205 lb 9.6 oz (93.3 kg), SpO2 100 %.  General appearance: alert and no distress Neck: no adenopathy, no carotid bruit, no JVD, supple, symmetrical, trachea midline and thyroid not enlarged, symmetric, no tenderness/mass/nodules Lungs: clear to auscultation bilaterally Heart:  regular rate and rhythm, S1, S2 normal, no murmur, click, rub or gallop Extremities: extremities normal, atraumatic, no cyanosis or edema Pulses: 2+ and symmetric Skin: Skin color, texture, turgor normal. No rashes or lesions Neurologic: Alert and oriented X 3, normal strength and tone. Normal symmetric reflexes. Normal coordination and gait  EKG not performed today  ASSESSMENT AND PLAN:   Essential hypertension History of essential hypertension blood pressure  measured at 151/95. She is on atenolol and hydrochlorothiazide. Continue current meds at current dosing  Palpitation [name]  had an episode of syncope back in October which resulted in a one-day hospitalization. She also had chest pain at the time. Her enzymes were negative. A 2-D echo was essentially normal. She was wearing an event monitor that showed sinus rhythm with sudden onset of heart rates in the 150 range suggesting PSVT versus atrial flutter with 2-1 response.I'm going to get a exercise Myoview stress test to rule out ischemic etiology and will refer her to one of my electrophysiology colleagues for further evaluation.      Runell Gess MD FACP,FACC,FAHA, [facility] 03/10/2017 11:34 AM

## 2017-03-10 NOTE — Assessment & Plan Note (Signed)
History of essential hypertension blood pressure measured at 151/95. She is on atenolol and hydrochlorothiazide. Continue current meds at current dosing

## 2017-03-10 NOTE — Patient Instructions (Signed)
Medication Instructions: Your physician recommends that you continue on your current medications as directed. Please refer to the Current Medication list given to you today.   Testing/Procedures: Your physician has requested that you have en exercise stress myoview. For further information please visit [email or web address] Please follow instruction sheet, as given.  Follow-Up:  You have been referred to EP Actuary) at [facility] for evaluation of PSVT.   Your physician recommends that you schedule a follow-up appointment in: 6 weeks with Dr. Allyson Sabal.  If you need a refill on your cardiac medications before your next appointment, please call your pharmacy.

## 2017-03-13 ENCOUNTER — Telehealth (HOSPITAL_COMMUNITY): Payer: Self-pay

## 2017-03-13 NOTE — Telephone Encounter (Signed)
Encounter complete. 

## 2017-03-18 ENCOUNTER — Ambulatory Visit (HOSPITAL_COMMUNITY)
Admission: RE | Admit: 2017-03-18 | Discharge: 2017-03-18 | Disposition: A | Discharge status: Home / Self Care | Payer: BC Managed Care – PPO | Source: Ambulatory Visit | Attending: Cardiovascular Disease | Admitting: Cardiovascular Disease

## 2017-03-18 DIAGNOSIS — R002 Palpitations: Secondary | ICD-10-CM | POA: Insufficient documentation

## 2017-03-18 DIAGNOSIS — R531 Weakness: Secondary | ICD-10-CM | POA: Insufficient documentation

## 2017-03-18 DIAGNOSIS — R55 Syncope and collapse: Secondary | ICD-10-CM | POA: Diagnosis not present

## 2017-03-18 DIAGNOSIS — R0789 Other chest pain: Secondary | ICD-10-CM | POA: Diagnosis not present

## 2017-03-18 DIAGNOSIS — Z8249 Family history of ischemic heart disease and other diseases of the circulatory system: Secondary | ICD-10-CM | POA: Diagnosis not present

## 2017-03-18 DIAGNOSIS — R5383 Other fatigue: Secondary | ICD-10-CM | POA: Diagnosis not present

## 2017-03-18 DIAGNOSIS — I10 Essential (primary) hypertension: Secondary | ICD-10-CM | POA: Insufficient documentation

## 2017-03-18 LAB — MYOCARDIAL PERFUSION IMAGING
CHL CUP MPHR: 159 {beats}/min
CHL CUP NUCLEAR SDS: 4
CHL RATE OF PERCEIVED EXERTION: 18
Estimated workload: 7.7 METS
Exercise duration (min): 6 min
Exercise duration (sec): 31 s
LVDIAVOL: 81 mL (ref 46–106)
LVSYSVOL: 28 mL
NUC STRESS TID: 1.22
Peak HR: 166 {beats}/min
Percent HR: 104 %
Rest HR: 64 {beats}/min
SRS: 1
SSS: 5

## 2017-03-18 MED ORDER — TECHNETIUM TC 99M TETROFOSMIN IV KIT
29.3000 | PACK | Freq: Once | INTRAVENOUS | Status: AC | PRN
  Administered 2017-03-18: 29.3 via INTRAVENOUS
  Filled 2017-03-18: qty 30

## 2017-03-18 MED ORDER — TECHNETIUM TC 99M TETROFOSMIN IV KIT
10.5000 | PACK | Freq: Once | INTRAVENOUS | Status: AC | PRN
  Administered 2017-03-18: 10.5 via INTRAVENOUS
  Filled 2017-03-18: qty 11

## 2017-03-18 MED ORDER — REGADENOSON 0.4 MG/5ML IV SOLN
0.4000 mg | Freq: Once | INTRAVENOUS | Status: AC
  Administered 2017-03-18: 0.4 mg via INTRAVENOUS

## 2017-03-27 ENCOUNTER — Institutional Professional Consult (permissible substitution): Payer: BC Managed Care – PPO | Admitting: Internal Medicine

## 2017-04-03 ENCOUNTER — Ambulatory Visit: Payer: BC Managed Care – PPO | Admitting: Internal Medicine

## 2017-04-03 ENCOUNTER — Encounter: Payer: Self-pay | Admitting: Internal Medicine

## 2017-04-03 VITALS — BP 122/80 | HR 68 | Ht 64.0 in | Wt 206.0 lb

## 2017-04-03 DIAGNOSIS — I10 Essential (primary) hypertension: Secondary | ICD-10-CM | POA: Diagnosis not present

## 2017-04-03 DIAGNOSIS — R002 Palpitations: Secondary | ICD-10-CM

## 2017-04-03 DIAGNOSIS — R55 Syncope and collapse: Secondary | ICD-10-CM

## 2017-04-03 DIAGNOSIS — I471 Supraventricular tachycardia: Secondary | ICD-10-CM | POA: Diagnosis not present

## 2017-04-03 NOTE — Progress Notes (Signed)
HPI Marissa Torres is referred today by Dr. Allyson Sabal for evaluation of SVT. 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 The episodes start and stop suddenly. She has been on medical therapy with atenolol. She has continued to have episodes. 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 She has occaisional PVC's and PAC's. She feels palpitations and sob with her episodes. She has had a single episode of syncope associated with heart racing.  Allergies  Allergen Reactions  . Combigan [Brimonidine Tartrate-Timolol] Other (See Comments)    lethargy     Current Outpatient Medications  Medication Sig Dispense Refill  . atenolol (TENORMIN) 25 MG tablet Take 1 tablet (25 mg total) by mouth daily. 30 tablet 1  . brinzolamide (AZOPT) 1 % ophthalmic suspension Place 1 drop into both eyes 2 (two) times daily.    . folic acid (FOLVITE) 1 MG tablet Take 2-3 mg by mouth See admin instructions. 2mg  Mon, Tues, Wed, Su, 3mg  Thurs, Fri, Sat    . hydrochlorothiazide (HYDRODIURIL) 25 MG tablet Take 1 tablet (25 mg total) by mouth daily. (Patient taking differently: Take 12.5 mg by mouth daily. ) 30 tablet 0  . meloxicam (MOBIC) 15 MG tablet Take 15 mg by mouth daily.    . methotrexate (RHEUMATREX) 2.5 MG tablet Take 17.5 mg by mouth once a week.  0  . timolol (BETIMOL) 0.25 % ophthalmic solution Place 1 drop into both eyes 2 (two) times daily.     No current facility-administered medications for this visit.      Past Medical History:  Diagnosis Date  . Hypertension     ROS:   All systems reviewed and negative except as noted in the HPI.   Past Surgical History:  Procedure Laterality Date  . ABDOMINAL HYSTERECTOMY    . CESAREAN SECTION    . REDUCTION MAMMAPLASTY Bilateral 2003     Family History  Problem Relation Age of Onset  . Arrhythmia Mother   . Arrhythmia Sister      Social History   Socioeconomic History  . Marital status: Married    Spouse  name: Not on file  . Number of children: Not on file  . Years of education: Not on file  . Highest education level: Not on file  Social Needs  . Financial resource strain: Not on file  . Food insecurity - worry: Not on file  . Food insecurity - inability: Not on file  . Transportation needs - medical: Not on file  . Transportation needs - non-medical: Not on file  Occupational History  . Not on file  Tobacco Use  . Smoking status: Former Wed  . Smokeless tobacco: Never Used  Substance and Sexual Activity  . Alcohol use: No  . Drug use: No  . Sexual activity: Yes    Birth control/protection: None  Other Topics Concern  . Not on file  Social History Narrative  . Not on file     BP 122/80   Pulse 68   Ht 5\' 4"  (1.626 m)   Wt 206 lb (93.4 kg)   SpO2 99%   BMI 35.36 kg/m   Physical Exam:  Well appearing NAD HEENT: Unremarkable Neck:  No JVD, no thyromegally Lymphatics:  No adenopathy Back:  No CVA tenderness Lungs:  Clear HEART:  Regular rate rhythm, no murmurs, no rubs, no clicks Abd:  soft, positive bowel sounds, no organomegally, no rebound, no guarding Ext:  2  plus pulses, no edema, no cyanosis, no clubbing Skin:  No rashes no nodules Neuro:  CN II through XII intact, motor grossly intact  EKG - NSR with no ventricular pre-excitation.   Assess/Plan: 1. SVT - I have discussed the treatment options with the patient in detail. The risks/benefits/goals/expectations of EP study and catheter ablation have been reviewed in detail and she will call us if she wishes to proceed.  2. HTN - she takes atenolol for her SVT and HTN. After ablation, she might be able to change the atenolol to another anti-hypertensive medication. 3. Syncope - she has only had a single episode. I suspect post termination pauses.  Marissa Torres.D.

## 2017-04-03 NOTE — Patient Instructions (Addendum)
Medication Instructions:  Your physician recommends that you continue on your current medications as directed. Please refer to the Current Medication list given to you today.  Labwork: None ordered.  Testing/Procedures: Your physician has recommended that you have an ablation. Catheter ablation is a medical procedure used to treat some cardiac arrhythmias (irregular heartbeats). During catheter ablation, a long, thin, flexible tube is put into a blood vessel in your groin (upper thigh), or neck. This tube is called an ablation catheter. It is then guided to your heart through the blood vessel. Radio frequency waves destroy small areas of heart tissue where abnormal heartbeats may cause an arrhythmia to start. Please see the instruction sheet given to you today.  Follow-Up:  The following dates are available for an ablation: January 16, 21, 23, 28 February 6, 11, 20, 25  Any Other Special Instructions Will Be Listed Below (If Applicable).  If you need a refill on your cardiac medications before your next appointment, please call your pharmacy.   Cardiac Ablation Cardiac ablation is a procedure to stop some heart tissue from causing problems. The heart has many electrical connections. Sometimes these connections make the heart beat very fast or irregularly. Removing some problem areas can improve the heart rhythm or make it normal. What happens before the procedure?  Follow instructions from your doctor about what you cannot eat or drink.  Ask your doctor about: ? Changing or stopping your normal medicines. This is important if you take diabetes medicines or blood thinners. ? Taking medicines such as aspirin and ibuprofen. These medicines can thin your blood. Do not take these medicines before your procedure if your doctor tells you not to.  Plan to have someone take you home.  If you will be going home right after the procedure, plan to have someone with you for 24 hours. What happens  during the procedure?  To lower your risk of infection: ? Your health care team will wash or sanitize their hands. ? Your skin will be washed with soap. ? Hair may be removed from your neck or groin.  An IV tube will be put into one of your veins.  You will be given a medicine to help you relax (sedative).  Skin on your neck or groin will be numbed.  A cut (incision) will be made in your neck or groin.  A needle will be put through your cut and into a vein in your neck or groin.  A tube (catheter) will be put into the needle. The tube will be moved to your heart. X-rays (fluoroscopy) will be used to help guide the tube.  Small devices (electrodes) on the tip of the tube will send out electrical currents.  Dye may be put through the tube. This helps your surgeon see your heart.  Electrical energy will be used to scar (ablate) some heart tissue. Your surgeon may use: ? Heat (radiofrequency energy). ? Laser energy. ? Extreme cold (cryoablation).  The tube will be taken out.  Pressure will be held on your cut. This helps stop bleeding.  A bandage (dressing) will be put on your cut. The procedure may vary. What happens after the procedure?  You will be monitored until your medicines have worn off.  Your cut will be watched for bleeding. You will need to lie still for a few hours.  Do not drive for 24 hours or as long as your doctor tells you. Summary  Cardiac ablation is a procedure to stop some heart  tissue from causing problems.  Electrical energy will be used to scar (ablate) some heart tissue. This information is not intended to replace advice given to you by your health care provider. Make sure you discuss any questions you have with your health care provider. Document Released: 12/01/2012 Document Revised: 02/18/2016 Document Reviewed: 02/18/2016 Elsevier Interactive Patient Education  2017 [employer].

## 2017-04-08 ENCOUNTER — Telehealth: Payer: Self-pay

## 2017-04-08 DIAGNOSIS — Z01812 Encounter for preprocedural laboratory examination: Secondary | ICD-10-CM

## 2017-04-08 NOTE — Telephone Encounter (Signed)
Date set for SVT ablation.  Pt to come in for labs prior to procedure.  Scheduled for 05/11/2017 @ 7:30 am.

## 2017-04-15 ENCOUNTER — Ambulatory Visit: Payer: BC Managed Care – PPO | Admitting: Cardiovascular Disease

## 2017-04-15 ENCOUNTER — Encounter: Payer: Self-pay | Admitting: Cardiovascular Disease

## 2017-04-15 DIAGNOSIS — R002 Palpitations: Secondary | ICD-10-CM

## 2017-04-15 DIAGNOSIS — I10 Essential (primary) hypertension: Secondary | ICD-10-CM

## 2017-04-15 NOTE — Patient Instructions (Signed)

## 2017-04-15 NOTE — Progress Notes (Signed)
04/15/2017 Marissa Torres   04-06-1956  [id number]  Primary Physician Maurice Small, MD Primary Cardiologist: Runell Gess MD Nicholes Calam[place], [place]  HPI:  Marissa Torres is a 62 y.o.  African-American who was admitted to [facility] [place]17 overnight for palpitations, syncope and chest pain. She has no other cardiovascular risk factors. I last saw her in the office 03/10/17 She did have a 2-D echo which was normal and a recent monitor that showed sinus rhythm with episodes of heart rates in the 150 range at rest suggesting a PSVT versus a- flutter with 2: 1 conduction. These episodes are also associated with weakness, tiredness and chest fullness. We performed Myoview stress testing on 03/18/17 which was normal. I referred her to Dr. Sharrell Ku for EP evaluation. He saw her on 04/03/17 and has scheduled her for PSVT ablation later this month.     Current Meds  Medication Sig  . atenolol (TENORMIN) 25 MG tablet Take 1 tablet (25 mg total) by mouth daily.  . brinzolamide (AZOPT) 1 % ophthalmic suspension Place 1 drop into both eyes 2 (two) times daily.  . folic acid (FOLVITE) 1 MG tablet Take 2-3 mg by mouth See admin instructions. 2mg  Mon, Tues, Wed, Su, 3mg  Thurs, Fri, Sat  . hydrochlorothiazide (HYDRODIURIL) 25 MG tablet Take 1 tablet (25 mg total) by mouth daily.  . meloxicam (MOBIC) 15 MG tablet Take 15 mg by mouth daily.  . methotrexate (RHEUMATREX) 2.5 MG tablet Take 17.5 mg by mouth once a week.  . timolol (BETIMOL) 0.25 % ophthalmic solution Place 1 drop into both eyes 2 (two) times daily.     Allergies  Allergen Reactions  . Combigan [Brimonidine Tartrate-Timolol] Other (See Comments)    lethargy    Social History   Socioeconomic History  . Marital status: Married    Spouse name: Not on file  . Number of children: Not on file  . Years of education: Not on file  . Highest education level: Not on file  Social Needs  . Financial resource strain:  Not on file  . Food insecurity - worry: Not on file  . Food insecurity - inability: Not on file  . Transportation needs - medical: Not on file  . Transportation needs - non-medical: Not on file  Occupational History  . Not on file  Tobacco Use  . Smoking status: Former Mon  . Smokeless tobacco: Never Used  Substance and Sexual Activity  . Alcohol use: No  . Drug use: No  . Sexual activity: Yes    Birth control/protection: None  Other Topics Concern  . Not on file  Social History Narrative  . Not on file     Review of Systems: General: negative for chills, fever, night sweats or weight changes.  Cardiovascular: negative for chest pain, dyspnea on exertion, edema, orthopnea, palpitations, paroxysmal nocturnal dyspnea or shortness of breath Dermatological: negative for rash Respiratory: negative for cough or wheezing Urologic: negative for hematuria Abdominal: negative for nausea, vomiting, diarrhea, bright red blood per rectum, melena, or hematemesis Neurologic: negative for visual changes, syncope, or dizziness All other systems reviewed and are otherwise negative except as noted above.    Blood pressure (!) 142/84, pulse 63, height 5\' 4"  (1.626 m), weight 216 lb (98 kg).  General appearance: alert and no distress Neck: no adenopathy, no carotid bruit, no JVD, supple, symmetrical, trachea midline and thyroid not enlarged, symmetric, no tenderness/mass/nodules Lungs: clear to auscultation bilaterally  Heart: regular rate and rhythm, S1, S2 normal, no murmur, click, rub or gallop Extremities: extremities normal, atraumatic, no cyanosis or edema Pulses: 2+ and symmetric Skin: Skin color, texture, turgor normal. No rashes or lesions Neurologic: Alert and oriented X 3, normal strength and tone. Normal symmetric reflexes. Normal coordination and gait  EKG not performed today  ASSESSMENT AND PLAN:   Essential hypertension History of essential hypertension blood pressure  measures 142/84. She is on atenolol and hydrochlorothiazide. Continue current meds at current dosing  Palpitation History of palpitations with documented PSVT. She saw Dr. Ladona Ridgel at my request on 04/03/17 and is scheduled for PSVT ablation later this month. A Myoview stress test performed 03/18/17 was low risk and nonischemic.      Runell Gess MD FACP,FACC,FAHA, [facility] 04/15/2017 11:07 AM

## 2017-04-15 NOTE — Assessment & Plan Note (Signed)
History of essential hypertension blood pressure measures 142/84. She is on atenolol and hydrochlorothiazide. Continue current meds at current dosing

## 2017-04-15 NOTE — Assessment & Plan Note (Signed)
History of palpitations with documented PSVT. She saw Dr. Ladona Ridgel at my request on 04/03/17 and is scheduled for PSVT ablation later this month. A Myoview stress test performed 03/18/17 was low risk and nonischemic.

## 2017-04-22 ENCOUNTER — Ambulatory Visit: Payer: BC Managed Care – PPO | Admitting: Cardiovascular Disease

## 2017-05-04 ENCOUNTER — Other Ambulatory Visit: Payer: BC Managed Care – PPO | Admitting: *Deleted

## 2017-05-04 ENCOUNTER — Other Ambulatory Visit: Payer: Self-pay | Admitting: Internal Medicine

## 2017-05-04 DIAGNOSIS — Z01812 Encounter for preprocedural laboratory examination: Secondary | ICD-10-CM

## 2017-05-05 LAB — BASIC METABOLIC PANEL
BUN/Creatinine Ratio: 24 (ref 12–28)
BUN: 21 mg/dL (ref 8–27)
CHLORIDE: 105 mmol/L (ref 96–106)
CO2: 24 mmol/L (ref 20–29)
CREATININE: 0.88 mg/dL (ref 0.57–1.00)
Calcium: 9.4 mg/dL (ref 8.7–10.3)
GFR calc Af Amer: 82 mL/min/{1.73_m2} (ref >59)
GFR calc non Af Amer: 71 mL/min/{1.73_m2} (ref >59)
GLUCOSE: 89 mg/dL (ref 65–99)
Potassium: 4.2 mmol/L (ref 3.5–5.2)
Sodium: 143 mmol/L (ref 134–144)

## 2017-05-05 LAB — CBC WITH DIFFERENTIAL/PLATELET
BASOS ABS: 0 10*3/uL (ref 0.0–0.2)
Basos: 1 %
EOS (ABSOLUTE): 0.2 10*3/uL (ref 0.0–0.4)
Eos: 3 %
HEMOGLOBIN: 10.5 g/dL — AB (ref 11.1–15.9)
Hematocrit: 32.9 % — ABNORMAL LOW (ref 34.0–46.6)
IMMATURE GRANS (ABS): 0 10*3/uL (ref 0.0–0.1)
Immature Granulocytes: 0 %
LYMPHS ABS: 2 10*3/uL (ref 0.7–3.1)
LYMPHS: 41 %
MCH: 29.6 pg (ref 26.6–33.0)
MCHC: 31.9 g/dL (ref 31.5–35.7)
MCV: 93 fL (ref 79–97)
Monocytes Absolute: 0.4 10*3/uL (ref 0.1–0.9)
Monocytes: 9 %
NEUTROS ABS: 2.3 10*3/uL (ref 1.4–7.0)
Neutrophils: 46 %
PLATELETS: 341 10*3/uL (ref 150–379)
RBC: 3.55 x10E6/uL — AB (ref 3.77–5.28)
RDW: 14.9 % (ref 12.3–15.4)
WBC: 5 10*3/uL (ref 3.4–10.8)

## 2017-05-11 ENCOUNTER — Encounter (HOSPITAL_COMMUNITY)
Admission: RE | Disposition: A | Discharge status: Home / Self Care | Payer: Self-pay | Source: Ambulatory Visit | Attending: Internal Medicine

## 2017-05-11 ENCOUNTER — Ambulatory Visit (HOSPITAL_COMMUNITY)
Admission: RE | Admit: 2017-05-11 | Discharge: 2017-05-11 | Disposition: A | Discharge status: Home / Self Care | Payer: BC Managed Care – PPO | Source: Ambulatory Visit | Attending: Internal Medicine | Admitting: Internal Medicine

## 2017-05-11 DIAGNOSIS — Z87891 Personal history of nicotine dependence: Secondary | ICD-10-CM | POA: Diagnosis not present

## 2017-05-11 DIAGNOSIS — I471 Supraventricular tachycardia, unspecified: Secondary | ICD-10-CM

## 2017-05-11 DIAGNOSIS — Z79899 Other long term (current) drug therapy: Secondary | ICD-10-CM | POA: Insufficient documentation

## 2017-05-11 DIAGNOSIS — I10 Essential (primary) hypertension: Secondary | ICD-10-CM | POA: Insufficient documentation

## 2017-05-11 HISTORY — PX: SVT ABLATION: EP1225

## 2017-05-11 SURGERY — SVT ABLATION

## 2017-05-11 MED ORDER — FENTANYL CITRATE (PF) 100 MCG/2ML IJ SOLN
INTRAMUSCULAR | Status: DC | PRN
  Administered 2017-05-11: 25 ug via INTRAVENOUS
  Administered 2017-05-11 (×7): 12.5 ug via INTRAVENOUS

## 2017-05-11 MED ORDER — BUPIVACAINE HCL (PF) 0.25 % IJ SOLN
INTRAMUSCULAR | Status: DC | PRN
  Administered 2017-05-11: 60 mL

## 2017-05-11 MED ORDER — FENTANYL CITRATE (PF) 100 MCG/2ML IJ SOLN
INTRAMUSCULAR | Status: AC
  Filled 2017-05-11: qty 2

## 2017-05-11 MED ORDER — HEPARIN (PORCINE) IN NACL 2-0.9 UNIT/ML-% IJ SOLN
INTRAMUSCULAR | Status: AC
  Filled 2017-05-11: qty 500

## 2017-05-11 MED ORDER — BUPIVACAINE HCL (PF) 0.25 % IJ SOLN
INTRAMUSCULAR | Status: AC
  Filled 2017-05-11: qty 30

## 2017-05-11 MED ORDER — ONDANSETRON HCL 4 MG/2ML IJ SOLN: 4.0000 mg | Freq: Four times a day (QID) | INTRAMUSCULAR | Status: DC | PRN

## 2017-05-11 MED ORDER — SODIUM CHLORIDE 0.9 % IV SOLN: 250.0000 mL | INTRAVENOUS | Status: DC | PRN

## 2017-05-11 MED ORDER — MIDAZOLAM HCL 5 MG/5ML IJ SOLN
INTRAMUSCULAR | Status: DC | PRN
  Administered 2017-05-11 (×9): 1 mg via INTRAVENOUS

## 2017-05-11 MED ORDER — SODIUM CHLORIDE 0.9% FLUSH: 3.0000 mL | Freq: Two times a day (BID) | INTRAVENOUS | Status: DC

## 2017-05-11 MED ORDER — ACETAMINOPHEN 325 MG PO TABS: 650.0000 mg | ORAL_TABLET | ORAL | Status: DC | PRN

## 2017-05-11 MED ORDER — HEPARIN (PORCINE) IN NACL 2-0.9 UNIT/ML-% IJ SOLN
INTRAMUSCULAR | Status: AC | PRN
  Administered 2017-05-11: 500 mL

## 2017-05-11 MED ORDER — SODIUM CHLORIDE 0.9 % IV SOLN
INTRAVENOUS | Status: DC
  Administered 2017-05-11: 06:00:00 via INTRAVENOUS

## 2017-05-11 MED ORDER — MIDAZOLAM HCL 5 MG/5ML IJ SOLN
INTRAMUSCULAR | Status: AC
  Filled 2017-05-11: qty 5

## 2017-05-11 MED ORDER — SODIUM CHLORIDE 0.9 % IV SOLN
INTRAVENOUS | Status: DC | PRN
  Administered 2017-05-11: 4 ug/min via INTRAVENOUS

## 2017-05-11 MED ORDER — SODIUM CHLORIDE 0.9% FLUSH: 3.0000 mL | INTRAVENOUS | Status: DC | PRN

## 2017-05-11 MED ORDER — ISOPROTERENOL HCL 0.2 MG/ML IJ SOLN
INTRAMUSCULAR | Status: AC
  Filled 2017-05-11: qty 5

## 2017-05-11 SURGICAL SUPPLY — 9 items
BAG SNAP BAND KOVER 36X36 (MISCELLANEOUS) ×2 IMPLANT
CATH HEX JOSEPH 2-5-2 65CM 6F (CATHETERS) ×2 IMPLANT
CATH JOSEPHSON QUAD-ALLRED 6FR (CATHETERS) ×4 IMPLANT
PACK EP LATEX FREE (CUSTOM PROCEDURE TRAY) ×3
PACK EP LF (CUSTOM PROCEDURE TRAY) IMPLANT
PAD DEFIB LIFELINK (PAD) ×2 IMPLANT
SHEATH PINNACLE 6F 10CM (SHEATH) ×4 IMPLANT
SHEATH PINNACLE 7F 10CM (SHEATH) ×2 IMPLANT
SHEATH PINNACLE 8F 10CM (SHEATH) ×2 IMPLANT

## 2017-05-11 NOTE — Progress Notes (Signed)
Up and walked and tolerated well; right groin and right neck sites stable no bleeding or hematoma

## 2017-05-11 NOTE — Discharge Instructions (Signed)
No driving for 3 days. No lifting over 5 lbs for 1 week. No sexual activity for 1 week. You may return to work in 1 week. Keep procedure site clean & dry. If you notice increased pain, swelling, bleeding or pus, call/return!  You may shower, but no soaking baths/hot tubs/pools for 1 week.  ° ° °Cardiac Ablation, Care After °This sheet gives you information about how to care for yourself after your procedure. Your health care provider may also give you more specific instructions. If you have problems or questions, contact your health care provider. °What can I expect after the procedure? °After the procedure, it is common to have: °· Bruising around your puncture site. °· Tenderness around your puncture site. °· Skipped heartbeats. °· Tiredness (fatigue). ° °Follow these instructions at home: °Puncture site care °· Follow instructions from your health care provider about how to take care of your puncture site. Make sure you: °? Wash your hands with soap and water before you change your bandage (dressing). If soap and water are not available, use hand sanitizer. °? Change your dressing as told by your health care provider. °? Leave stitches (sutures), skin glue, or adhesive strips in place. These skin closures may need to stay in place for up to 2 weeks. If adhesive strip edges start to loosen and curl up, you may trim the loose edges. Do not remove adhesive strips completely unless your health care provider tells you to do that. °· Check your puncture site every day for signs of infection. Check for: °? Redness, swelling, or pain. °? Fluid or blood. If your puncture site starts to bleed, lie down on your back, apply firm pressure to the area, and contact your health care provider. °? Warmth. °? Pus or a bad smell. °Driving °· Ask your health care provider when it is safe for you to drive again after the procedure. °· Do not drive or use heavy machinery while taking prescription pain medicine. °· Do not drive for 24  hours if you were given a medicine to help you relax (sedative) during your procedure. °Activity °· Avoid activities that take a lot of effort for at least 3 days after your procedure. °· Do not lift anything that is heavier than 10 lb (4.5 kg), or the limit that you are told, until your health care provider says that it is safe. °· Return to your normal activities as told by your health care provider. Ask your health care provider what activities are safe for you. °General instructions °· Take over-the-counter and prescription medicines only as told by your health care provider. °· Do not use any products that contain nicotine or tobacco, such as cigarettes and e-cigarettes. If you need help quitting, ask your health care provider. °· Do not take baths, swim, or use a hot tub until your health care provider approves. °· Do not drink alcohol for 24 hours after your procedure. °· Keep all follow-up visits as told by your health care provider. This is important. °Contact a health care provider if: °· You have redness, mild swelling, or pain around your puncture site. °· You have fluid or blood coming from your puncture site that stops after applying firm pressure to the area. °· Your puncture site feels warm to the touch. °· You have pus or a bad smell coming from your puncture site. °· You have a fever. °· You have chest pain or discomfort that spreads to your neck, jaw, or arm. °· You are   sweating a lot. °· You feel nauseous. °· You have a fast or irregular heartbeat. °· You have shortness of breath. °· You are dizzy or light-headed and feel the need to lie down. °· You have pain or numbness in the arm or leg closest to your puncture site. °Get help right away if: °· Your puncture site suddenly swells. °· Your puncture site is bleeding and the bleeding does not stop after applying firm pressure to the area. °These symptoms may represent a serious problem that is an emergency. Do not wait to see if the symptoms will  go away. Get medical help right away. Call your local emergency services (911 in the [place] Do not drive yourself to the hospital. °Summary °· After the procedure, it is normal to have bruising and tenderness at the puncture site in your groin, neck, or forearm. °· Check your puncture site every day for signs of infection. °· Get help right away if your puncture site is bleeding and the bleeding does not stop after applying firm pressure to the area. This is a medical emergency. °This information is not intended to replace advice given to you by your health care provider. Make sure you discuss any questions you have with your health care provider. °Document Released: 07/10/2016 Document Revised: 07/10/2016 Document Reviewed: 07/10/2016 °Elsevier Interactive Patient Education © 2018 [employer]vier Inc[employer] ° ° °Moderate Conscious Sedation, Adult, Care After °These instructions provide you with information about caring for yourself after your procedure. Your health care provider may also give you more specific instructions. Your treatment has been planned according to current medical practices, but problems sometimes occur. Call your health care provider if you have any problems or questions after your procedure. °What can I expect after the procedure? °After your procedure, it is common: °· To feel sleepy for several hours. °· To feel clumsy and have poor balance for several hours. °· To have poor judgment for several hours. °· To vomit if you eat too soon. ° °Follow these instructions at home: °For at least 24 hours after the procedure: ° °· Do not: °? Participate in activities where you could fall or become injured. °? Drive. °? Use heavy machinery. °? Drink alcohol. °? Take sleeping pills or medicines that cause drowsiness. °? Make important decisions or sign legal documents. °? Take care of children on your own. °· Rest. °Eating and drinking °· Follow the diet recommended by your health care provider. °· If you  vomit: °? Drink water, juice, or soup when you can drink without vomiting. °? Make sure you have little or no nausea before eating solid foods. °General instructions °· Have a responsible adult stay with you until you are awake and alert. °· Take over-the-counter and prescription medicines only as told by your health care provider. °· If you smoke, do not smoke without supervision. °· Keep all follow-up visits as told by your health care provider. This is important. °Contact a health care provider if: °· You keep feeling nauseous or you keep vomiting. °· You feel light-headed. °· You develop a rash. °· You have a fever. °Get help right away if: °· You have trouble breathing. °This information is not intended to replace advice given to you by your health care provider. Make sure you discuss any questions you have with your health care provider. °Document Released: 01/19/2013 Document Revised: 09/03/2015 Document Reviewed: 07/21/2015 °Elsevier Interactive Patient Education © 2018 Elsevier Inc. ° ° ° ° °

## 2017-05-11 NOTE — H&P (Signed)
HPI Marissa Torres is referred today by Dr. Allyson Sabal for evaluation of SVT. She is a pleasant 62 yo woman with a h/o tachypalpitations dating back to childhood. The episodes start and stop suddenly. She has been on medical therapy with atenolol. She has continued to have episodes. 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 She has occaisional PVC's and PAC's. She feels palpitations and sob with her episodes. She has had a single episode of syncope associated with heart racing.  Allergies  Allergen Reactions  . Combigan [Brimonidine Tartrate-Timolol] Other (See Comments)    lethargy           Current Outpatient Medications  Medication Sig Dispense Refill  . atenolol (TENORMIN) 25 MG tablet Take 1 tablet (25 mg total) by mouth daily. 30 tablet 1  . brinzolamide (AZOPT) 1 % ophthalmic suspension Place 1 drop into both eyes 2 (two) times daily.    . folic acid (FOLVITE) 1 MG tablet Take 2-3 mg by mouth See admin instructions. 2mg  Mon, Tues, Wed, Su, 3mg  Thurs, Fri, Sat    . hydrochlorothiazide (HYDRODIURIL) 25 MG tablet Take 1 tablet (25 mg total) by mouth daily. (Patient taking differently: Take 12.5 mg by mouth daily. ) 30 tablet 0  . meloxicam (MOBIC) 15 MG tablet Take 15 mg by mouth daily.    . methotrexate (RHEUMATREX) 2.5 MG tablet Take 17.5 mg by mouth once a week.  0  . timolol (BETIMOL) 0.25 % ophthalmic solution Place 1 drop into both eyes 2 (two) times daily.     No current facility-administered medications for this visit.          Past Medical History:  Diagnosis Date  . Hypertension     ROS:   All systems reviewed and negative except as noted in the HPI.        Past Surgical History:  Procedure Laterality Date  . ABDOMINAL HYSTERECTOMY    . CESAREAN SECTION    . REDUCTION MAMMAPLASTY Bilateral 2003          Family History  Problem Relation Age of Onset  . Arrhythmia Mother   . Arrhythmia Sister      Social History         Socioeconomic History  . Marital status: Married    Spouse name: Not on file  . Number of children: Not on file  . Years of education: Not on file  . Highest education level: Not on file  Social Needs  . Financial resource strain: Not on file  . Food insecurity - worry: Not on file  . Food insecurity - inability: Not on file  . Transportation needs - medical: Not on file  . Transportation needs - non-medical: Not on file  Occupational History  . Not on file  Tobacco Use  . Smoking status: Former Sun  . Smokeless tobacco: Never Used  Substance and Sexual Activity  . Alcohol use: No  . Drug use: No  . Sexual activity: Yes    Birth control/protection: None  Other Topics Concern  . Not on file  Social History Narrative  . Not on file     BP 122/80   Pulse 68   Ht 5\' 4"  (1.626 m)   Wt 206 lb (93.4 kg)   SpO2 99%   BMI 35.36 kg/m   Physical Exam:  Well appearing NAD HEENT: Unremarkable Neck:  No JVD, no thyromegally Lymphatics:  No adenopathy Back:  No CVA tenderness Lungs:  Clear HEART:  Regular rate rhythm,  no murmurs, no rubs, no clicks Abd:  soft, positive bowel sounds, no organomegally, no rebound, no guarding Ext:  2 plus pulses, no edema, no cyanosis, no clubbing Skin:  No rashes no nodules Neuro:  CN II through XII intact, motor grossly intact  EKG - NSR with no ventricular pre-excitation.   Assess/Plan: 1. SVT - I have discussed the treatment options with the patient in detail. The risks/benefits/goals/expectations of EP study and catheter ablation have been reviewed in detail and she will call us if she wishes to proceed.  2. HTN - she takes atenolol for her SVT and HTN. After ablation, she might be able to change the atenolol to another anti-hypertensive medication. 3. Syncope - she has only had a single episode. I suspect post termination pauses.  Marissa Torres.  EP Attending  Patient seen and examined. No change since prior  clinic visit. We will plan to proceed with EP study and catheter ablation of SVT.  Marissa Torres.D.

## 2017-05-11 NOTE — Progress Notes (Signed)
Site area: Right groin a 6 french X2 and 8 french venous sheaths were removed  Site Prior to Removal:  Level 0  Pressure Applied For 20 MINUTES    Bedrest Beginning at 1040am  Manual:   Yes.    Patient Status During Pull:  stable  Post Pull Groin Site:  Level 0  Post Pull Instructions Given:  Yes.    Post Pull Pulses Present:  Yes.    Dressing Applied:  Yes.    Comments:  VS remain stable

## 2017-05-11 NOTE — Progress Notes (Signed)
Site area: Right Internal Jugular a 7 french venous sheath was removed  Site Prior to Removal:  Level 0  Pressure Applied For 10 MINUTES    Bedrest Beginning at 1040am  Manual:   Yes.    Patient Status During Pull:  stable  Post Pull Groin Site:  Level 0  Post Pull Instructions Given:  Yes.    Post Pull Pulses Present:  Yes.    Dressing Applied:  Yes.    Comments:  VS remain stable

## 2017-05-12 ENCOUNTER — Encounter (HOSPITAL_COMMUNITY): Payer: Self-pay | Admitting: Internal Medicine

## 2017-06-04 ENCOUNTER — Encounter: Payer: Self-pay | Admitting: Internal Medicine

## 2017-06-11 ENCOUNTER — Encounter: Payer: Self-pay | Admitting: Internal Medicine

## 2017-06-11 ENCOUNTER — Ambulatory Visit: Payer: BC Managed Care – PPO | Admitting: Internal Medicine

## 2017-06-11 VITALS — BP 166/90 | HR 68 | Ht 64.0 in | Wt 213.4 lb

## 2017-06-11 DIAGNOSIS — I471 Supraventricular tachycardia: Secondary | ICD-10-CM | POA: Diagnosis not present

## 2017-06-11 DIAGNOSIS — R55 Syncope and collapse: Secondary | ICD-10-CM

## 2017-06-11 DIAGNOSIS — R002 Palpitations: Secondary | ICD-10-CM

## 2017-06-11 MED ORDER — ATENOLOL 25 MG PO TABS: ORAL_TABLET | ORAL | 3 refills | Status: DC

## 2017-06-11 NOTE — Progress Notes (Signed)
HPI Mrs. Popko returns today for followup of SVT. She underwent EP study about a month ago and had no inducible SVT. She has been stable in the interim. She wears a fit bit and has noted only 2 episodes of heart racing, each lasting a few minutes and one at 110 and another at 150/min. She also has occaisional palpitations, not associated with heart racing. Allergies  Allergen Reactions  . Combigan [Brimonidine Tartrate-Timolol] Other (See Comments)    lethargy     Current Outpatient Medications  Medication Sig Dispense Refill  . acetaminophen (TYLENOL) 650 MG CR tablet Take 1,300 mg by mouth daily.    Marland Kitchen atenolol (TENORMIN) 25 MG tablet Take 1 tablet (25 mg total) by mouth daily. 30 tablet 1  . brinzolamide (AZOPT) 1 % ophthalmic suspension Place 1 drop into both eyes 3 (three) times daily.     . folic acid (FOLVITE) 1 MG tablet Take 2-3 mg by mouth See admin instructions. Take 3 mg by mouth daily on Thursday, Friday and Saturday. Take 2 mg by mouth daily on all other days.    . hydrochlorothiazide (HYDRODIURIL) 25 MG tablet Take 1 tablet (25 mg total) by mouth daily. (Patient taking differently: Take 12.5 mg by mouth daily. ) 30 tablet 0  . meloxicam (MOBIC) 15 MG tablet Take 15 mg by mouth daily.    . methotrexate (RHEUMATREX) 2.5 MG tablet Take 17.5 mg by mouth every Friday.   0  . timolol (BETIMOL) 0.25 % ophthalmic solution Place 1 drop into both eyes 2 (two) times daily.    Marland Kitchen triamcinolone cream (KENALOG) 0.1 % Apply 1 application topically 2 (two) times daily as needed (for ecsema).   0   No current facility-administered medications for this visit.      Past Medical History:  Diagnosis Date  . Hypertension     ROS:   All systems reviewed and negative except as noted in the HPI.   Past Surgical History:  Procedure Laterality Date  . ABDOMINAL HYSTERECTOMY    . CESAREAN SECTION    . REDUCTION MAMMAPLASTY Bilateral 2003  . SVT ABLATION N/A 05/11/2017   Procedure:  SVT ABLATION;  Surgeon: Marinus Maw, MD;  Location: [facility] INVASIVE CV LAB;  Service: Cardiovascular;  Laterality: N/A;     Family History  Problem Relation Age of Onset  . Arrhythmia Mother   . Arrhythmia Sister      Social History   Socioeconomic History  . Marital status: Married    Spouse name: Not on file  . Number of children: Not on file  . Years of education: Not on file  . Highest education level: Not on file  Social Needs  . Financial resource strain: Not on file  . Food insecurity - worry: Not on file  . Food insecurity - inability: Not on file  . Transportation needs - medical: Not on file  . Transportation needs - non-medical: Not on file  Occupational History  . Not on file  Tobacco Use  . Smoking status: Former Games developer  . Smokeless tobacco: Never Used  Substance and Sexual Activity  . Alcohol use: No  . Drug use: No  . Sexual activity: Yes    Birth control/protection: None  Other Topics Concern  . Not on file  Social History Narrative  . Not on file     BP (!) 166/90   Pulse 68   Ht 5\' 4"  (1.626 m)   Wt 213 lb  6.4 oz (96.8 kg)   BMI 36.63 kg/m   Physical Exam:  Well appearing overweight 62 yo woman, NAD HEENT: Unremarkable Neck:  6 cm JVD, no thyromegally Lymphatics:  No adenopathy Back:  No CVA tenderness Lungs:  Clear with no wheezes HEART:  Regular rate rhythm, no murmurs, no rubs, no clicks Abd:  soft, positive bowel sounds, no organomegally, no rebound, no guarding Ext:  2 plus pulses, no edema, no cyanosis, no clubbing Skin:  No rashes no nodules Neuro:  CN II through XII intact, motor grossly intact  EKG - nsr   Assess/Plan: 1. SVT - she has been better with minimal symptoms. I have encouraged her to take an extra atenolol if she has break through symptoms. She is encouraged to avoid caffeine.  2. HTN - her blood pressure is a bit high today but it is better at home. I have encouraged her to lose weight and exercise more. She  may ultimately require more anti-htn medical therapy. 3. Obesity - weight loss is encouraged.   Leonia Reeves.D.

## 2017-06-11 NOTE — Patient Instructions (Addendum)
Medication Instructions:  Your physician has recommended you make the following change in your medication:  1.  Take an additional atenolol as needed for heart racing.  Labwork: None ordered.  Testing/Procedures: None ordered.  Follow-Up: Your physician wants you to follow-up XN:TZGY or August with Dr. Ladona Ridgel.  You will receive a reminder letter in the mail two months in advance. If you don't receive a letter, please call our office to schedule the follow-up appointment.   Any Other Special Instructions Will Be Listed Below (If Applicable).  If you need a refill on your cardiac medications before your next appointment, please call your pharmacy.

## 2017-08-14 ENCOUNTER — Other Ambulatory Visit: Payer: Self-pay | Admitting: Family Medicine

## 2017-08-14 DIAGNOSIS — Z1231 Encounter for screening mammogram for malignant neoplasm of breast: Secondary | ICD-10-CM

## 2017-09-16 ENCOUNTER — Ambulatory Visit
Admission: RE | Admit: 2017-09-16 | Discharge: 2017-09-16 | Disposition: A | Discharge status: Home / Self Care | Payer: BC Managed Care – PPO | Source: Ambulatory Visit | Attending: Family Medicine | Admitting: Family Medicine

## 2017-09-16 DIAGNOSIS — Z1231 Encounter for screening mammogram for malignant neoplasm of breast: Secondary | ICD-10-CM

## 2017-12-16 ENCOUNTER — Ambulatory Visit: Payer: BC Managed Care – PPO | Admitting: Internal Medicine

## 2017-12-16 ENCOUNTER — Encounter: Payer: Self-pay | Admitting: Internal Medicine

## 2017-12-16 VITALS — BP 132/74 | HR 62 | Ht 64.0 in | Wt 207.8 lb

## 2017-12-16 DIAGNOSIS — I471 Supraventricular tachycardia, unspecified: Secondary | ICD-10-CM

## 2017-12-16 DIAGNOSIS — I10 Essential (primary) hypertension: Secondary | ICD-10-CM | POA: Diagnosis not present

## 2017-12-16 NOTE — Progress Notes (Signed)
HPI Marissa Torres returns today for followup of her palpitations. She is a pleasant 62 yo woman with a h/o heart racing but no inducible arrhythmias on EP testing several months ago. She has retired from her job in the [employer] system. She feels well. She has minimal palpitations. SHe remains active at home. Her husband was diagnosed with colon CA.  Allergies  Allergen Reactions  . Combigan [Brimonidine Tartrate-Timolol] Other (See Comments)    lethargy     Current Outpatient Medications  Medication Sig Dispense Refill  . acetaminophen (TYLENOL) 650 MG CR tablet Take 1,300 mg by mouth daily.    Marland Kitchen atenolol (TENORMIN) 25 MG tablet Take one tablet by mouth daily.  May take an extra tablet as needed for breakthrough heart racing. 180 tablet 3  . brinzolamide (AZOPT) 1 % ophthalmic suspension Place 1 drop into both eyes 3 (three) times daily.     . folic acid (FOLVITE) 1 MG tablet Take 2-3 mg by mouth See admin instructions. Take 3 mg by mouth daily on Thursday, Friday and Saturday. Take 2 mg by mouth daily on all other days.    . hydrochlorothiazide (HYDRODIURIL) 25 MG tablet Take 1 tablet (25 mg total) by mouth daily. (Patient taking differently: Take 12.5 mg by mouth daily. ) 30 tablet 0  . meloxicam (MOBIC) 15 MG tablet Take 15 mg by mouth daily.    . methotrexate (RHEUMATREX) 2.5 MG tablet Take 17.5 mg by mouth every Friday.   0  . timolol (BETIMOL) 0.25 % ophthalmic solution Place 1 drop into both eyes 2 (two) times daily.    Marland Kitchen triamcinolone cream (KENALOG) 0.1 % Apply 1 application topically 2 (two) times daily as needed (for ecsema).   0   No current facility-administered medications for this visit.      Past Medical History:  Diagnosis Date  . Hypertension     ROS:   All systems reviewed and negative except as noted in the HPI.   Past Surgical History:  Procedure Laterality Date  . ABDOMINAL HYSTERECTOMY    . CESAREAN SECTION    . REDUCTION MAMMAPLASTY  Bilateral 2003  . SVT ABLATION N/A 05/11/2017   Procedure: SVT ABLATION;  Surgeon: Marinus Maw, MD;  Location: [facility] INVASIVE CV LAB;  Service: Cardiovascular;  Laterality: N/A;     Family History  Problem Relation Age of Onset  . Arrhythmia Mother   . Arrhythmia Sister      Social History   Socioeconomic History  . Marital status: Married    Spouse name: Not on file  . Number of children: Not on file  . Years of education: Not on file  . Highest education level: Not on file  Occupational History  . Not on file  Social Needs  . Financial resource strain: Not on file  . Food insecurity:    Worry: Not on file    Inability: Not on file  . Transportation needs:    Medical: Not on file    Non-medical: Not on file  Tobacco Use  . Smoking status: Former Games developer  . Smokeless tobacco: Never Used  Substance and Sexual Activity  . Alcohol use: No  . Drug use: No  . Sexual activity: Yes    Birth control/protection: None  Lifestyle  . Physical activity:    Days per week: Not on file    Minutes per session: Not on file  . Stress: Not on file  Relationships  .  Social connections:    Talks on phone: Not on file    Gets together: Not on file    Attends religious service: Not on file    Active member of club or organization: Not on file    Attends meetings of clubs or organizations: Not on file    Relationship status: Not on file  . Intimate partner violence:    Fear of current or ex partner: Not on file    Emotionally abused: Not on file    Physically abused: Not on file    Forced sexual activity: Not on file  Other Topics Concern  . Not on file  Social History Narrative  . Not on file     BP 132/74   Pulse 62   Ht 5\' 4"  (1.626 m)   Wt 207 lb 12.8 oz (94.3 kg)   BMI 35.67 kg/m   Physical Exam:  Well appearing 62 yo woman, NAD HEENT: Unremarkable Neck:  6 cm JVD, no thyromegally Lymphatics:  No adenopathy Back:  No CVA tenderness Lungs:  Clear with no  wheezes HEART:  Regular rate rhythm, no murmurs, no rubs, no clicks Abd:  soft, positive bowel sounds, no organomegally, no rebound, no guarding Ext:  2 plus pulses, no edema, no cyanosis, no clubbing Skin:  No rashes no nodules Neuro:  CN II through XII intact, motor grossly intact  EKG - nsr   Assess/Plan: 1. SVT - she had no inducible arrhythmias at EP testing. Her symptoms are well controlled.  2. HTN - her blood pressure is mostly well controlled. She is encouraged to remain active and maintain a low caffeine diet. She will continue her beta blocker.  77.D.

## 2017-12-16 NOTE — Patient Instructions (Addendum)
Medication Instructions:  Your physician recommends that you continue on your current medications as directed. Please refer to the Current Medication list given to you today.  Labwork: None ordered.  Testing/Procedures: None ordered.  Follow-Up: Your physician wants you to follow-up in: as needed with Dr. Taylor.   You will receive a reminder letter in the mail two months in advance. If you don't receive a letter, please call our office to schedule the follow-up appointment.   Any Other Special Instructions Will Be Listed Below (If Applicable).  If you need a refill on your cardiac medications before your next appointment, please call your pharmacy.   

## 2018-02-26 ENCOUNTER — Encounter: Payer: Self-pay | Admitting: Podiatry

## 2018-02-26 ENCOUNTER — Ambulatory Visit: Payer: BC Managed Care – PPO | Admitting: Podiatry

## 2018-02-26 VITALS — BP 158/87 | HR 64 | Resp 16

## 2018-02-26 DIAGNOSIS — B351 Tinea unguium: Secondary | ICD-10-CM

## 2018-02-26 LAB — HEPATIC FUNCTION PANEL
AG RATIO: 1.5 (calc) (ref 1.0–2.5)
ALKALINE PHOSPHATASE (APISO): 71 U/L (ref 33–130)
ALT: 9 U/L (ref 6–29)
AST: 16 U/L (ref 10–35)
Albumin: 4.1 g/dL (ref 3.6–5.1)
Bilirubin, Direct: 0.1 mg/dL (ref 0.0–0.2)
GLOBULIN: 2.8 g/dL (ref 1.9–3.7)
Indirect Bilirubin: 0.2 mg/dL (calc) (ref 0.2–1.2)
TOTAL PROTEIN: 6.9 g/dL (ref 6.1–8.1)
Total Bilirubin: 0.3 mg/dL (ref 0.2–1.2)

## 2018-03-21 NOTE — Progress Notes (Addendum)
Subjective: Marissa Torres presents today referred by Maurice Small, MD with cc of painful, discolored, thick toenails which interfere with daily activities.  Pain is aggravated when wearing enclosed shoe gear. She relates she has tried Sports coach and her nail started lifting up and eventually broke off.  Past Medical History:  Diagnosis Date  . Hypertension     Patient Active Problem List   Diagnosis Date Noted  . SVT (supraventricular tachycardia) (HCC) 05/11/2017  . Palpitation 01/22/2017  . Syncope   . Essential hypertension     Past Surgical History:  Procedure Laterality Date  . ABDOMINAL HYSTERECTOMY    . CESAREAN SECTION    . REDUCTION MAMMAPLASTY Bilateral 2003  . SVT ABLATION N/A 05/11/2017   Procedure: SVT ABLATION;  Surgeon: Marinus Maw, MD;  Location: [facility] INVASIVE CV LAB;  Service: Cardiovascular;  Laterality: N/A;     Current Outpatient Medications:  .  acetaminophen (TYLENOL) 650 MG CR tablet, Take 1,300 mg by mouth daily., Disp: , Rfl:  .  atenolol (TENORMIN) 25 MG tablet, Take one tablet by mouth daily.  May take an extra tablet as needed for breakthrough heart racing., Disp: 180 tablet, Rfl: 3 .  brinzolamide (AZOPT) 1 % ophthalmic suspension, Place 1 drop into both eyes 3 (three) times daily. , Disp: , Rfl:  .  folic acid (FOLVITE) 1 MG tablet, Take 2-3 mg by mouth See admin instructions. Take 3 mg by mouth daily on Thursday, Friday and Saturday. Take 2 mg by mouth daily on all other days., Disp: , Rfl:  .  hydrochlorothiazide (HYDRODIURIL) 25 MG tablet, Take 1 tablet (25 mg total) by mouth daily. (Patient taking differently: Take 12.5 mg by mouth daily. ), Disp: 30 tablet, Rfl: 0 .  meloxicam (MOBIC) 15 MG tablet, Take 15 mg by mouth daily., Disp: , Rfl:  .  methotrexate (RHEUMATREX) 2.5 MG tablet, Take 17.5 mg by mouth every Friday. , Disp: , Rfl: 0 .  timolol (BETIMOL) 0.25 % ophthalmic solution, Place 1 drop into both eyes 2 (two) times daily., Disp: ,  Rfl:  .  triamcinolone cream (KENALOG) 0.1 %, Apply 1 application topically 2 (two) times daily as needed (for ecsema). , Disp: , Rfl: 0  Allergies  Allergen Reactions  . Combigan [Brimonidine Tartrate-Timolol] Other (See Comments)    lethargy    Social History   Occupational History  . Not on file  Tobacco Use  . Smoking status: Former Games developer  . Smokeless tobacco: Never Used  Substance and Sexual Activity  . Alcohol use: No  . Drug use: No  . Sexual activity: Yes    Birth control/protection: None    Family History  Problem Relation Age of Onset  . Arrhythmia Mother   . Arrhythmia Sister      There is no immunization history on file for this patient.   Review of systems: Positive Findings in bold print.  Constitutional:  chills, fatigue, fever, sweats, weight change Communication: Nurse, learning disability, sign Presenter, broadcasting, hand writing, iPad/Android device Eyes: diplopia, glare,  light sensitivity, eyeglasses, blindness Ears nose mouth throat: Hard of hearing, deaf, sign language,  vertigo,   bloody nose,  rhinitis,  cold sores, snoring Cardiovascular: HTN, edema, arrhythmia, pacemaker in place, defibrillator in place,  chest pain/tightness, chronic anticoagulation, blood clot Respiratory:  difficulty breathing, denies congestion, SOB, wheezing, cough Gastrointestinal: abdominal pain, diarrhea, nausea, vomiting,  Genitourinary:  nocturia,  pain on urination,  blood in urine, Foley catheter, urinary urgency Musculoskeletal: Uses mobility aid,  cramping, stiff joints, painful joints,  Skin: +changes in toenails, color change dryness, itchy skin, mole changes, or rash  Neurological: numbness, paresthesias, burning in feet, denies fainting,  seizure, change in speech. denies headaches, memory problems/poor historian, cerebral palsy Endocrine: diabetes, hypothyroidism, hyperthyroidism,  dry mouth, flushing, denies heat intolerance,  cold intolerance,  excessive thirst, denies  polyuria,  nocturia Hematological:  easy bleeding,  excessive bleeding, easy bruising, enlarged lymph nodes, on long term blood thinner Allergy/immunological:  hive, frequent infections, multiple drug allergies, seasonal allergies,  Psychiatric:  anxiety, depression, mood disorder, suicidal ideations, hallucinations   Objective: Vascular Examination: Capillary refill time immediate x 10 digits Dorsalis pedis and posterior tibial pulses present b/l Digital hair present x 10 digits Skin temperature gradient WNL b/l  Dermatological Examination: Skin with normal turgor, texture and tone b/l  Toenails 1-5 b/l mildly discolored, thick, dystrophic with subungual debris and pain with palpation to nailbeds due to thickness of nails.  Musculoskeletal: Muscle strength 5/5 to all LE muscle groups  Neurological: Sensation intact with 10 gram monofilament Vibratory sensation intact.  Hepatic Function Panel (Order [id number])  Hepatic Function Panel  Order: [id number]  Status:  Final result  Visible to patient:  Yes (MyChart)  Next appt:  None  Dx:  Nail fungus   Ref Range & Units 3wk ago  Total Protein 6.1 - 8.1 g/dL 6.9   Albumin 3.6 - 5.1 g/dL 4.1   Globulin 1.9 - 3.7 g/dL (calc) 2.8   AG Ratio 1.0 - 2.5 (calc) 1.5   Total Bilirubin 0.2 - 1.2 mg/dL 0.3   Bilirubin, Direct 0.0 - 0.2 mg/dL 0.1   Indirect Bilirubin 0.2 - 1.2 mg/dL (calc) 0.2   Alkaline phosphatase (APISO) 33 - 130 U/L 71   AST 10 - 35 U/L 16   ALT 6 - 29 U/L 9   Resulting Agency  [facility]      Specimen Collected: 02/26/18 09:51 Last Resulted: 02/26/18 23:03           Assessment: 1. Painful onychomycosis toenails 1-5 b/l   Plan: 1. Discussed onychomycosis and treatment options.  Literature dispensed on today. 2. Nail clippings taken today. LFTs ordered as Ms. Richner is interested in oral antifungal therapy. 3. Patient to continue soft, supportive shoe gear 4. Patient to report any pedal injuries to medical  professional immediately. 5. Follow up 3 months. Patient/POA to call should there be a concern in the interim.

## 2018-05-05 ENCOUNTER — Telehealth: Payer: Self-pay | Admitting: Podiatry

## 2018-05-05 NOTE — Telephone Encounter (Signed)
Left message informing pt Dr. Eloy End had reviewed the testing results and I would give her the review and orders, to call again. I apologized for the delay, explaining we were having trouble with the scanned results being sent to the doctors and thanked her for calling.

## 2018-05-05 NOTE — Telephone Encounter (Signed)
I was seen on 15 November by Dr. Eloy End and she took clippings of my toenails. I have not received a call to know what the cause was, so I'm following up. If someone could please call me back.

## 2018-05-06 MED ORDER — TERBINAFINE HCL 250 MG PO TABS: 250.0000 mg | ORAL_TABLET | Freq: Every day | ORAL | 0 refills | Status: DC

## 2018-05-06 NOTE — Addendum Note (Signed)
Addended by: Alphia Kava D on: 05/06/2018 02:04 PM   Modules accepted: Orders

## 2018-05-06 NOTE — Telephone Encounter (Signed)
Dr. Eloy End states pt is + for fungus and her liver function test is normal and pt may choose lamisil 250mg  #90 one tablet daily or laser, and follow up in 3 months. I informed pt and she states she would like to take the lamisil. Orders sent to the [id number].

## 2018-05-06 NOTE — Telephone Encounter (Signed)
Pt called for results.

## 2018-08-03 ENCOUNTER — Other Ambulatory Visit: Payer: Self-pay | Admitting: Family Medicine

## 2018-08-03 DIAGNOSIS — Z1231 Encounter for screening mammogram for malignant neoplasm of breast: Secondary | ICD-10-CM

## 2018-08-09 ENCOUNTER — Other Ambulatory Visit: Payer: Self-pay

## 2018-08-09 ENCOUNTER — Ambulatory Visit: Payer: BC Managed Care – PPO | Admitting: Podiatry

## 2018-08-09 DIAGNOSIS — B351 Tinea unguium: Secondary | ICD-10-CM | POA: Diagnosis not present

## 2018-08-09 DIAGNOSIS — M79675 Pain in left toe(s): Secondary | ICD-10-CM

## 2018-08-09 DIAGNOSIS — M79674 Pain in right toe(s): Secondary | ICD-10-CM

## 2018-08-09 NOTE — Patient Instructions (Signed)

## 2018-08-13 ENCOUNTER — Encounter: Payer: Self-pay | Admitting: Podiatry

## 2018-08-13 NOTE — Progress Notes (Signed)
Subjective: Marissa Torres presents today with for follow up mycotic toenails thick toenails 1-5 b/l.  Her LFTs were normal and she has been taking terbinafine.  She notes no side effects with the medication.  Maurice Small, MD is her PCP.   Current Outpatient Medications:  .  acetaminophen (TYLENOL) 650 MG CR tablet, Take 1,300 mg by mouth daily., Disp: , Rfl:  .  atenolol (TENORMIN) 25 MG tablet, Take one tablet by mouth daily.  May take an extra tablet as needed for breakthrough heart racing., Disp: 180 tablet, Rfl: 3 .  brimonidine (ALPHAGAN) 0.2 % ophthalmic solution, , Disp: , Rfl:  .  brinzolamide (AZOPT) 1 % ophthalmic suspension, Place 1 drop into both eyes 3 (three) times daily. , Disp: , Rfl:  .  folic acid (FOLVITE) 1 MG tablet, Take 2-3 mg by mouth See admin instructions. Take 3 mg by mouth daily on Thursday, Friday and Saturday. Take 2 mg by mouth daily on all other days., Disp: , Rfl:  .  hydrochlorothiazide (HYDRODIURIL) 25 MG tablet, Take 1 tablet (25 mg total) by mouth daily. (Patient taking differently: Take 12.5 mg by mouth daily. ), Disp: 30 tablet, Rfl: 0 .  meloxicam (MOBIC) 15 MG tablet, Take 15 mg by mouth daily., Disp: , Rfl:  .  methotrexate (RHEUMATREX) 2.5 MG tablet, Take 17.5 mg by mouth every Friday. , Disp: , Rfl: 0 .  terbinafine (LAMISIL) 250 MG tablet, Take 1 tablet (250 mg total) by mouth daily., Disp: 90 tablet, Rfl: 0 .  timolol (BETIMOL) 0.25 % ophthalmic solution, Place 1 drop into both eyes 2 (two) times daily., Disp: , Rfl:  .  timolol (TIMOPTIC) 0.5 % ophthalmic solution, , Disp: , Rfl:  .  Travoprost, BAK Free, (TRAVATAN) 0.004 % SOLN ophthalmic solution, , Disp: , Rfl:  .  triamcinolone cream (KENALOG) 0.1 %, Apply 1 application topically 2 (two) times daily as needed (for ecsema). , Disp: , Rfl: 0  Allergies  Allergen Reactions  . Combigan [Brimonidine Tartrate-Timolol] Other (See Comments)    lethargy    Objective:  Vascular  Examination: Capillary refill time immediate x 10 digits.  Dorsalis pedis and Posterior tibial pulses palpable b/l.  Digital hair present x 10 digits.  Skin temperature gradient WNL b/l.  Dermatological Examination: Skin with normal turgor, texture and tone b/l.  Toenails 1-5 b/l discolored, thick, dystrophic with subungual debris and pain with palpation to nailbeds due to thickness of nails.  Musculoskeletal: Muscle strength 5/5 to all LE muscle groups  HAV with bunion b/l.  No pain, crepitus or joint limitation noted with ROM.   Neurological: Sensation intact with 10 gram monofilament.  Vibratory sensation intact.  Assessment: Painful onychomycosis toenails 1-5 b/l   Plan: 1. Toenails 1-5 b/l were debrided in length and girth without iatrogenic bleeding. 2. Continue terbinafine until all gone.  3. Discussed medication will continue to work after she's completed her course. 4. Patient to continue soft, supportive shoe gear daily. 5. Patient to report any pedal injuries to medical professional immediately. 6. Follow up 6 months. If she has residual clinical onychomycosis, discussed introducing laser therapy. She related understanding. 7. Patient/POA to call should there be a concern in the interim.

## 2018-09-30 ENCOUNTER — Ambulatory Visit
Admission: RE | Admit: 2018-09-30 | Discharge: 2018-09-30 | Disposition: A | Discharge status: Home / Self Care | Payer: BC Managed Care – PPO | Source: Ambulatory Visit | Attending: Family Medicine | Admitting: Family Medicine

## 2018-09-30 ENCOUNTER — Other Ambulatory Visit: Payer: Self-pay

## 2018-09-30 DIAGNOSIS — Z1231 Encounter for screening mammogram for malignant neoplasm of breast: Secondary | ICD-10-CM

## 2019-02-01 ENCOUNTER — Other Ambulatory Visit: Payer: Self-pay

## 2019-02-01 ENCOUNTER — Ambulatory Visit: Payer: BC Managed Care – PPO | Admitting: Podiatry

## 2019-02-01 ENCOUNTER — Encounter: Payer: Self-pay | Admitting: Podiatry

## 2019-02-01 DIAGNOSIS — M79675 Pain in left toe(s): Secondary | ICD-10-CM

## 2019-02-01 DIAGNOSIS — M79674 Pain in right toe(s): Secondary | ICD-10-CM

## 2019-02-01 DIAGNOSIS — B351 Tinea unguium: Secondary | ICD-10-CM

## 2019-02-01 NOTE — Patient Instructions (Signed)

## 2019-02-03 NOTE — Progress Notes (Signed)
Subjective: ALIYYAH Torres is seen today for follow up painful, elongated, thickened toenails 1-5 b/l feet that she cannot cut. Pain interferes with daily activities. Aggravating factor includes wearing enclosed shoe gear and relieved with periodic debridement.  Current Outpatient Medications on File Prior to Visit  Medication Sig  . acetaminophen (TYLENOL) 650 MG CR tablet Take 1,300 mg by mouth daily.  Marland Kitchen atenolol (TENORMIN) 25 MG tablet Take one tablet by mouth daily.  May take an extra tablet as needed for breakthrough heart racing.  . brimonidine (ALPHAGAN) 0.2 % ophthalmic solution   . brinzolamide (AZOPT) 1 % ophthalmic suspension Place 1 drop into both eyes 3 (three) times daily.   . folic acid (FOLVITE) 1 MG tablet Take 2-3 mg by mouth See admin instructions. Take 3 mg by mouth daily on Thursday, Friday and Saturday. Take 2 mg by mouth daily on all other days.  . hydrochlorothiazide (HYDRODIURIL) 25 MG tablet Take 1 tablet (25 mg total) by mouth daily. (Patient taking differently: Take 12.5 mg by mouth daily. )  . meloxicam (MOBIC) 15 MG tablet Take 15 mg by mouth daily.  . methotrexate (RHEUMATREX) 2.5 MG tablet Take 17.5 mg by mouth every Friday.   . terbinafine (LAMISIL) 250 MG tablet Take 1 tablet (250 mg total) by mouth daily.  . timolol (BETIMOL) 0.25 % ophthalmic solution Place 1 drop into both eyes 2 (two) times daily.  . timolol (TIMOPTIC) 0.5 % ophthalmic solution   . Travoprost, BAK Free, (TRAVATAN) 0.004 % SOLN ophthalmic solution   . triamcinolone cream (KENALOG) 0.1 % Apply 1 application topically 2 (two) times daily as needed (for ecsema).    No current facility-administered medications on file prior to visit.      Allergies  Allergen Reactions  . Combigan [Brimonidine Tartrate-Timolol] Other (See Comments)    lethargy     Objective:  Vascular Examination: Capillary refill time immediate x 10 digits.  Dorsalis pedis present b/l.  Posterior tibial pulses  present b/l.  Digital hair present b/l.  Skin temperature gradient WNL b/l.   Dermatological Examination: Skin with normal turgor, texture and tone b/l.  Toenails 1-5 b/l discolored, thick, dystrophic with subungual debris and pain with palpation to nailbeds due to thickness of nails.  Musculoskeletal: Muscle strength 5/5 to all LE muscle groups b/l.  HAV with bunion b/l.  No pain, crepitus or joint limitation noted with ROM.   Neurological Examination: Protective sensation intact with 10 gram monofilament bilaterally.  Epicritic sensation present bilaterally.  Vibratory sensation intact bilaterally.   Assessment: Painful onychomycosis toenails 1-5 b/l   Plan: 1. Toenails 1-5 b/l were debrided in length and girth without iatrogenic bleeding. 2. Patient to continue soft, supportive shoe gear 3. Patient to report any pedal injuries to medical professional immediately. 4. Follow up 3 months.  5. Patient/POA to call should there be a concern in the interim.

## 2019-02-08 ENCOUNTER — Ambulatory Visit: Payer: BC Managed Care – PPO | Admitting: Podiatry

## 2019-05-11 ENCOUNTER — Ambulatory Visit: Payer: BC Managed Care – PPO | Admitting: Podiatry

## 2019-05-30 ENCOUNTER — Ambulatory Visit: Payer: BC Managed Care – PPO | Attending: Internal Medicine

## 2019-05-30 DIAGNOSIS — Z23 Encounter for immunization: Secondary | ICD-10-CM | POA: Insufficient documentation

## 2019-05-30 NOTE — Progress Notes (Signed)
   Covid-19 Vaccination Clinic  Name:  Marissa Torres    MRN: [id number] DOB: Sep 09, 1955  05/30/2019  Marissa Torres was observed post Covid-19 immunization for 15 minutes without incidence. She was provided with Vaccine Information Sheet and instruction to access the V-Safe system.   Marissa Torres was instructed to call 911 with any severe reactions post vaccine: Marland Kitchen Difficulty breathing  . Swelling of your face and throat  . A fast heartbeat  . A bad rash all over your body  . Dizziness and weakness    Immunizations Administered    Name Date Dose VIS Date Route   Pfizer COVID-19 Vaccine 05/30/2019  3:24 PM 0.3 mL 03/25/2019 Intramuscular   Manufacturer: [employer], [employer]   Lot: [id number]   NDC: [address]-1

## 2019-06-10 ENCOUNTER — Ambulatory Visit: Payer: BC Managed Care – PPO | Admitting: Podiatry

## 2019-06-10 ENCOUNTER — Other Ambulatory Visit: Payer: Self-pay

## 2019-06-10 ENCOUNTER — Ambulatory Visit: Payer: Self-pay

## 2019-06-10 ENCOUNTER — Encounter: Payer: Self-pay | Admitting: Podiatry

## 2019-06-10 VITALS — Temp 97.7°F

## 2019-06-10 DIAGNOSIS — M79675 Pain in left toe(s): Secondary | ICD-10-CM

## 2019-06-10 DIAGNOSIS — M79674 Pain in right toe(s): Secondary | ICD-10-CM | POA: Diagnosis not present

## 2019-06-10 DIAGNOSIS — B351 Tinea unguium: Secondary | ICD-10-CM

## 2019-06-10 NOTE — Patient Instructions (Signed)

## 2019-06-11 NOTE — Progress Notes (Signed)
Subjective: Marissa Torres presents today for follow up of painful mycotic nails b/l that are difficult to trim. Pain interferes with ambulation. Aggravating factors include wearing enclosed shoe gear. Pain is relieved with periodic professional debridement.   Allergies  Allergen Reactions  . Combigan [Brimonidine Tartrate-Timolol] Other (See Comments)    lethargy     Objective: Vitals:   06/10/19 1532  Temp: 97.7 F (36.5 C)    Vascular Examination:  Capillary refill time to digits immediate b/l, palpable DP pulses b/l, palpable PT pulses b/l, pedal hair present b/l and skin temperature gradient within normal limits b/l  Dermatological Examination: Pedal skin with normal turgor, texture and tone bilaterally, no open wounds bilaterally, no interdigital macerations bilaterally and toenails 1-5 b/l elongated, dystrophic, thickened, crumbly with subungual debris  Musculoskeletal: Normal muscle strength 5/5 to all lower extremity muscle groups bilaterally, no pain crepitus or joint limitation noted with ROM b/l and bunion deformity noted b/l  Neurological: Protective sensation intact 5/5 intact bilaterally with 10g monofilament b/l and vibratory sensation intact b/l  Assessment: 1. Pain due to onychomycosis of toenails of both feet    Plan: -Toenails 1-5 b/l were debrided in length and girth with sterile nail nippers and dremel without iatrogenic bleeding. -Patient to continue soft, supportive shoe gear daily. -Patient to report any pedal injuries to medical professional immediately. -Patient/POA to call should there be question/concern in the interim.  Return in about 3 months (around 09/07/2019) for nail trim.

## 2019-06-21 ENCOUNTER — Ambulatory Visit: Payer: BC Managed Care – PPO | Attending: Internal Medicine

## 2019-06-21 DIAGNOSIS — Z23 Encounter for immunization: Secondary | ICD-10-CM | POA: Insufficient documentation

## 2019-06-21 NOTE — Progress Notes (Signed)
   Covid-19 Vaccination Clinic  Name:  Marissa Torres    MRN: [id number] DOB: 1956/01/27  06/21/2019  [name] was observed post Covid-19 immunization for 15 minutes without incident. She was provided with Vaccine Information Sheet and instruction to access the V-Safe system.   [name] was instructed to call 911 with any severe reactions post vaccine: Marland Kitchen Difficulty breathing  . Swelling of face and throat  . A fast heartbeat  . A bad rash all over body  . Dizziness and weakness   Immunizations Administered    Name Date Dose VIS Date Route   Pfizer COVID-19 Vaccine 06/21/2019  8:45 AM 0.3 mL 03/25/2019 Intramuscular   Manufacturer: [employer], [employer]   Lot: [id number]   NDC: [address]-8

## 2019-06-22 ENCOUNTER — Ambulatory Visit: Payer: BC Managed Care – PPO

## 2019-07-21 DIAGNOSIS — K635 Polyp of colon: Secondary | ICD-10-CM | POA: Insufficient documentation

## 2019-07-21 DIAGNOSIS — M159 Polyosteoarthritis, unspecified: Secondary | ICD-10-CM | POA: Insufficient documentation

## 2019-07-21 DIAGNOSIS — M069 Rheumatoid arthritis, unspecified: Secondary | ICD-10-CM | POA: Insufficient documentation

## 2019-07-21 DIAGNOSIS — Z6833 Body mass index (BMI) 33.0-33.9, adult: Secondary | ICD-10-CM | POA: Insufficient documentation

## 2019-08-17 ENCOUNTER — Other Ambulatory Visit: Payer: Self-pay | Admitting: Family Medicine

## 2019-08-17 DIAGNOSIS — Z1231 Encounter for screening mammogram for malignant neoplasm of breast: Secondary | ICD-10-CM

## 2019-09-09 ENCOUNTER — Ambulatory Visit: Payer: BC Managed Care – PPO | Admitting: Podiatry

## 2019-10-04 ENCOUNTER — Ambulatory Visit
Admission: RE | Admit: 2019-10-04 | Discharge: 2019-10-04 | Disposition: A | Discharge status: Home / Self Care | Payer: BC Managed Care – PPO | Source: Ambulatory Visit | Attending: Family Medicine | Admitting: Family Medicine

## 2019-10-04 ENCOUNTER — Other Ambulatory Visit: Payer: Self-pay

## 2019-10-04 DIAGNOSIS — Z1231 Encounter for screening mammogram for malignant neoplasm of breast: Secondary | ICD-10-CM

## 2019-10-09 IMAGING — CT CT ANGIO CHEST
2 of 8 series · 19 of 46 positions shown · IV contrast (isovue)
Comparison: 01/21/2017

CLINICAL DATA: Shortness of breath, positive D-dimer

EXAM:
CT ANGIOGRAPHY CHEST WITH CONTRAST
TECHNIQUE: Multidetector CT imaging of the chest was performed using the
standard protocol during bolus administration of intravenous
contrast. Multiplanar CT image reconstructions and MIPs were
obtained to evaluate the vascular anatomy.
CONTRAST:  100 cc Isovue 370 IV

[Series 6: thins · axial · 0.64mm/px · z∈[+890,+1149]mm · 16 of 285 slices shown]
[im 13/285  lung]
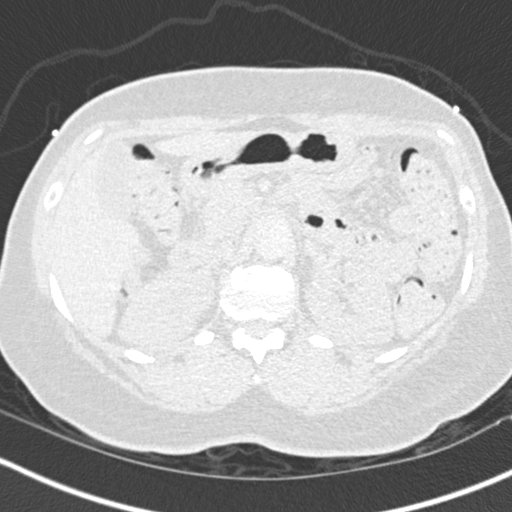
[im 26/285  soft-tissue]
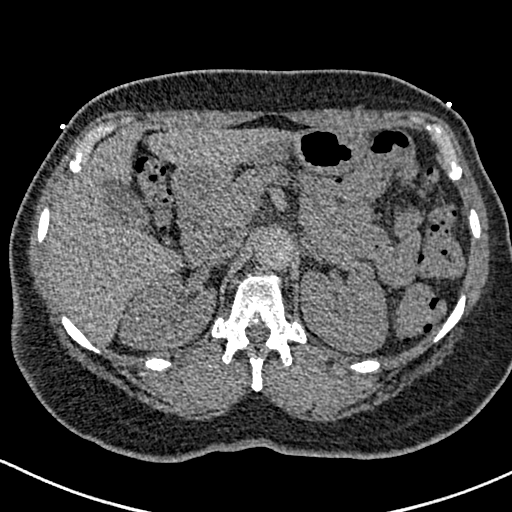
[im 52/285  lung]
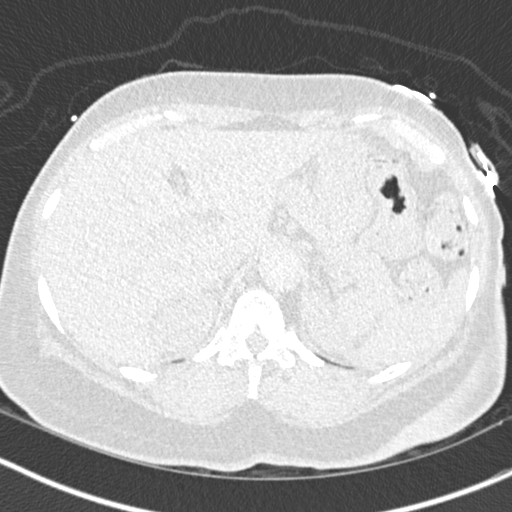
[im 65/285  soft-tissue]
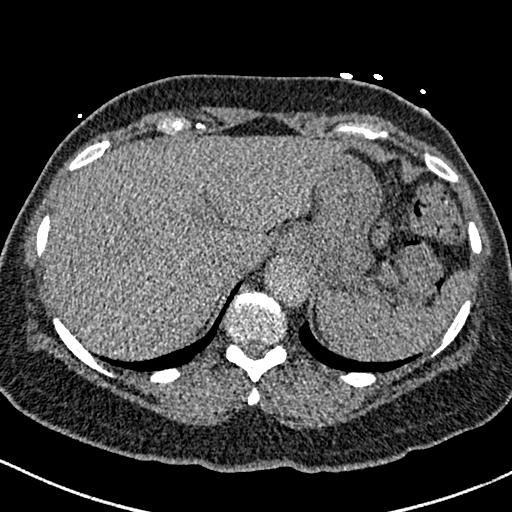
[im 78/285  lung]
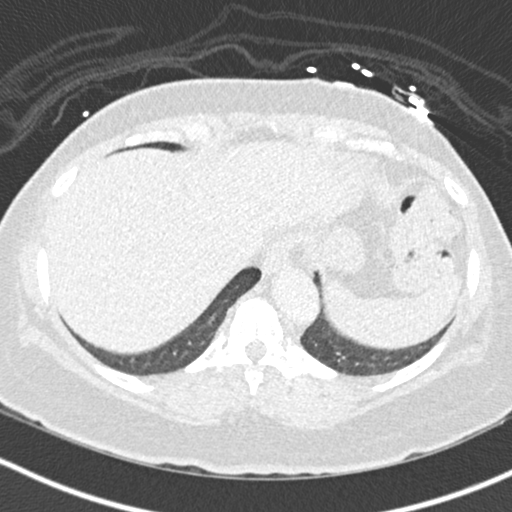
[im 104/285  soft-tissue]
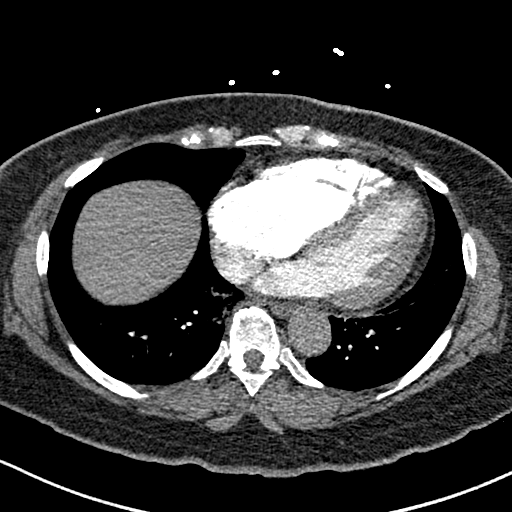
[im 117/285  lung]
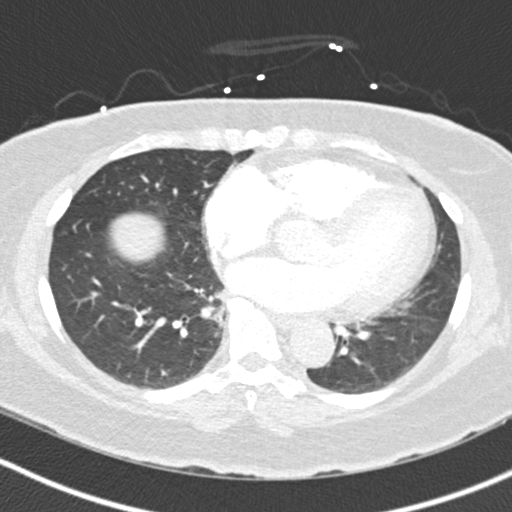
[im 130/285  soft-tissue]
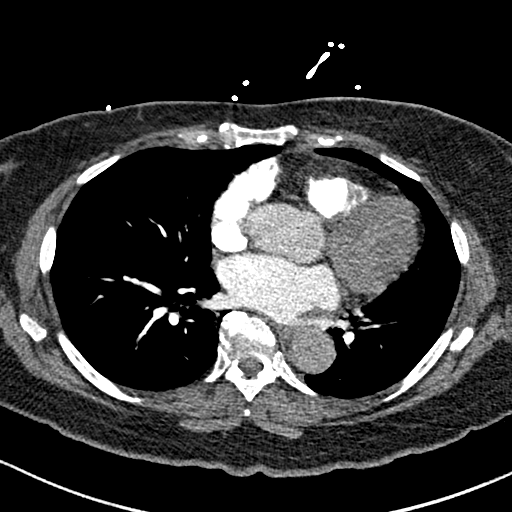
[im 155/285  lung]
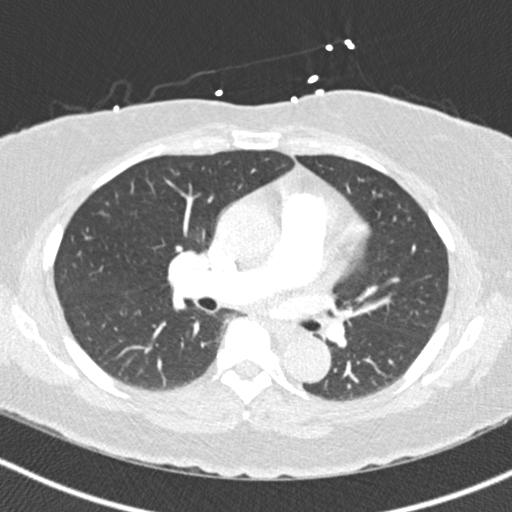
[im 168/285  soft-tissue]
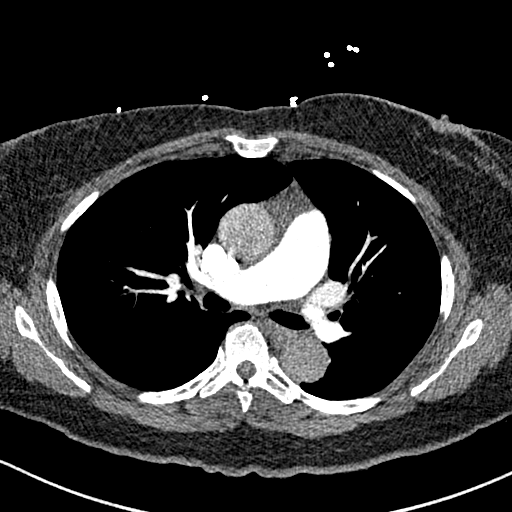
[im 181/285  lung]
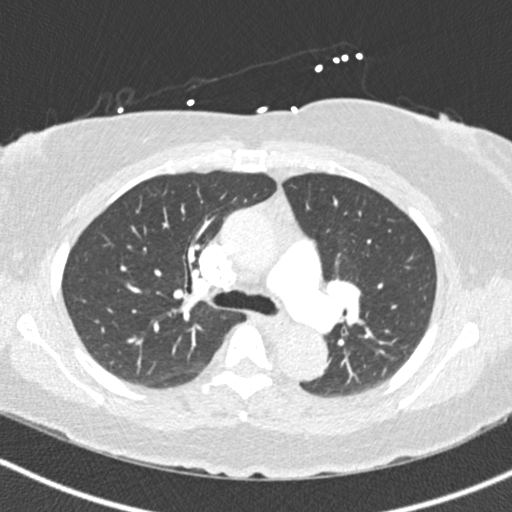
[im 207/285  soft-tissue]
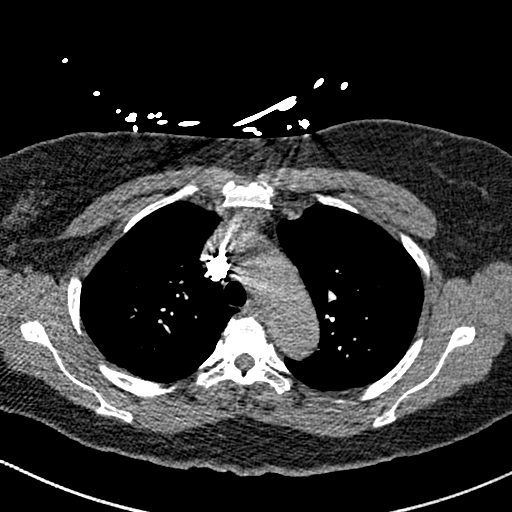
[im 220/285  lung]
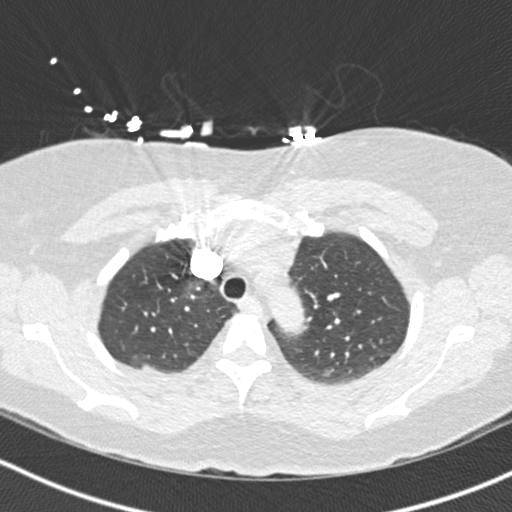
[im 233/285  soft-tissue]
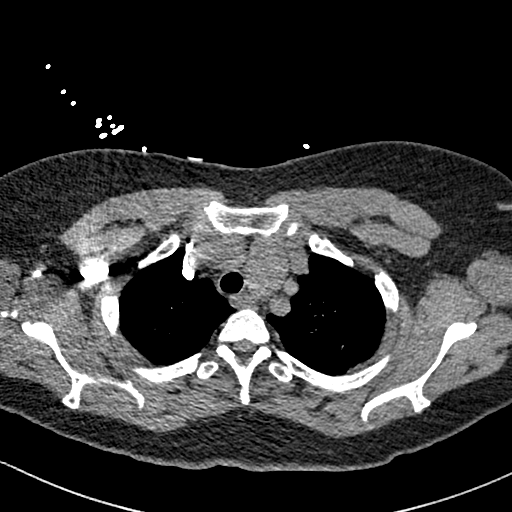
[im 259/285  lung]
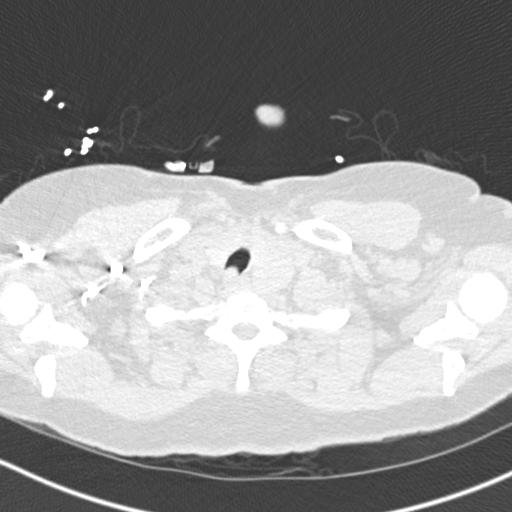
[im 272/285  soft-tissue]
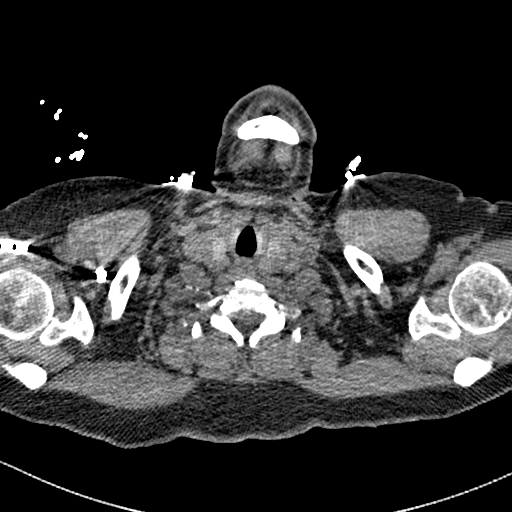

[Series 8: coronal mpr · coronal · 0.59mm/px · 3 of 106 slices shown]
[im 27/106  soft-tissue]
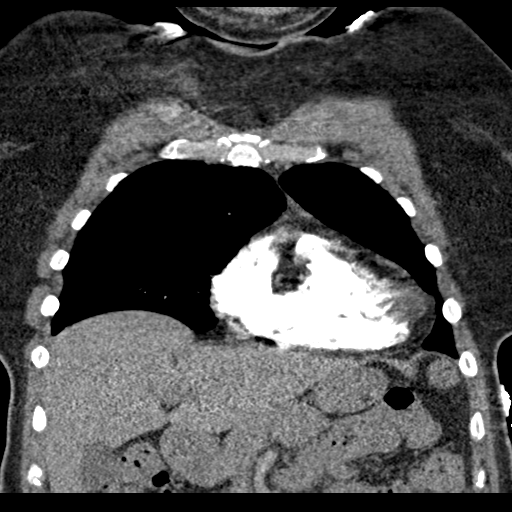
[im 53/106  soft-tissue]
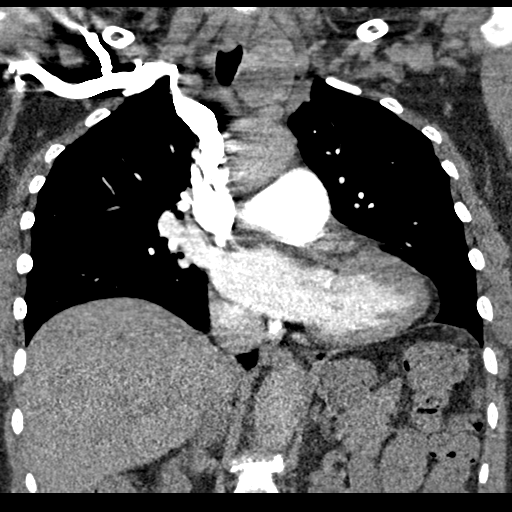
[im 79/106  soft-tissue]
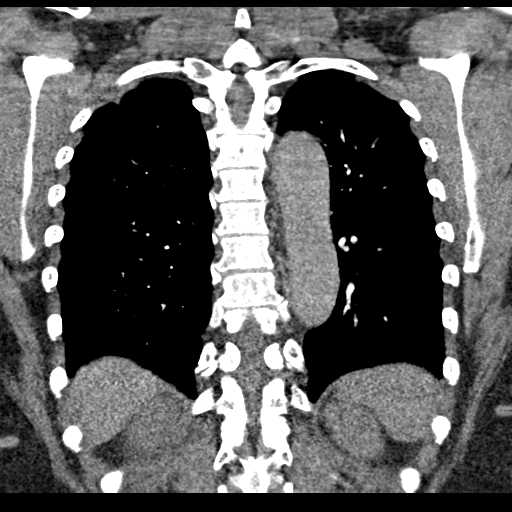

[19 of 46 positions shown; findings below may reference images not displayed]

FINDINGS: Cardiovascular: No filling defects in the pulmonary arteries to
suggest pulmonary emboli. Heart is upper limits normal in size.
Aorta is normal caliber.

Mediastinum/Nodes: No mediastinal, hilar, or axillary adenopathy.

Lungs/Pleura: Small subpleural nodule in the anterior right middle
lobe on image 50 measures 5 mm. Lungs otherwise clear. No effusions.

Upper Abdomen: Imaging into the upper abdomen shows no acute
findings.

Musculoskeletal: Chest wall soft tissues are unremarkable. No acute
bony abnormality.

Review of the MIP images confirms the above findings.
IMPRESSION: No evidence of pulmonary embolus.

5 mm anterior right middle lobe nodule. No follow-up needed if
patient is low-risk. Non-contrast chest CT can be considered in 12
months if patient is high-risk. This recommendation follows the
consensus statement: Guidelines for Management of Incidental
Pulmonary Nodules Detected on CT Images: From the [HOSPITAL]

No acute cardiopulmonary disease.

## 2019-11-14 ENCOUNTER — Ambulatory Visit: Payer: BC Managed Care – PPO | Admitting: Podiatry

## 2019-11-22 ENCOUNTER — Other Ambulatory Visit: Payer: Self-pay

## 2019-11-22 ENCOUNTER — Ambulatory Visit: Payer: BC Managed Care – PPO | Admitting: Podiatry

## 2019-11-22 ENCOUNTER — Encounter: Payer: Self-pay | Admitting: Podiatry

## 2019-11-22 DIAGNOSIS — M79675 Pain in left toe(s): Secondary | ICD-10-CM

## 2019-11-22 DIAGNOSIS — B351 Tinea unguium: Secondary | ICD-10-CM

## 2019-11-22 DIAGNOSIS — M79674 Pain in right toe(s): Secondary | ICD-10-CM | POA: Diagnosis not present

## 2019-11-23 NOTE — Progress Notes (Signed)
Subjective: Marissa Torres presents today for follow up of painful mycotic nails b/l that are difficult to trim. Pain interferes with ambulation. Aggravating factors include wearing enclosed shoe gear. Pain is relieved with periodic professional debridement.   She and her husband recently took a trip to [place], [place].  She voices no new pedal problems on today's visit.  Allergies  Allergen Reactions  . Combigan [Brimonidine Tartrate-Timolol] Other (See Comments)    lethargy     Objective: There were no vitals filed for this visit.  Vascular Examination:  Capillary refill time to digits immediate b/l, palpable DP pulses b/l, palpable PT pulses b/l, pedal hair present b/l and skin temperature gradient within normal limits b/l  Dermatological Examination: Pedal skin with normal turgor, texture and tone bilaterally, no open wounds bilaterally, no interdigital macerations bilaterally and toenails 1-5 b/l elongated, dystrophic, thickened, crumbly with subungual debris  Musculoskeletal: Normal muscle strength 5/5 to all lower extremity muscle groups bilaterally, no pain crepitus or joint limitation noted with ROM b/l and bunion deformity noted b/l  Neurological: Protective sensation intact 5/5 intact bilaterally with 10g monofilament b/l and vibratory sensation intact b/l  Assessment: 1. Pain due to onychomycosis of toenails of both feet    Plan: -Toenails 1-5 b/l were debrided in length and girth with sterile nail nippers and dremel without iatrogenic bleeding. -Patient to continue soft, supportive shoe gear daily. -Patient to report any pedal injuries to medical professional immediately. -Patient/POA to call should there be question/concern in the interim.  Return in about 3 months (around 02/22/2020) for nail trim.

## 2019-12-26 ENCOUNTER — Telehealth: Payer: Self-pay | Admitting: Physician Assistant

## 2019-12-26 NOTE — Telephone Encounter (Signed)
Received a new hem referral from Dr. Valentina Lucks for anemia. Ms. Marissa Torres has been cld and scheduled to see Marissa Torres on 9/30 at 1pm w/labs at 1230pm. Pt aware to arrive 30 minutes early.

## 2019-12-27 ENCOUNTER — Ambulatory Visit: Payer: BC Managed Care – PPO | Attending: Internal Medicine

## 2019-12-27 DIAGNOSIS — Z23 Encounter for immunization: Secondary | ICD-10-CM

## 2019-12-27 NOTE — Progress Notes (Signed)
   Covid-19 Vaccination Clinic  Name:  Marissa Torres    MRN: [id number] DOB: 1955-05-03  12/27/2019  Marissa Torres was observed post Covid-19 immunization for 15 minutes without incident. She was provided with Vaccine Information Sheet and instruction to access the V-Safe system.   Marissa Torres was instructed to call 911 with any severe reactions post vaccine: Marland Kitchen Difficulty breathing  . Swelling of face and throat  . A fast heartbeat  . A bad rash all over body  . Dizziness and weakness

## 2020-01-10 NOTE — Progress Notes (Signed)
[facility] Telephone:([phone]) [phone]   Fax:([phone]  CONSULT NOTE  REFERRING PHYSICIAN: Dr. Laurann Montana   REASON FOR CONSULTATION:  Anemia  HPI Marissa Torres is a 64 y.o. female with a past medical history significant for rheumatoid arthritis, sciatica, glaucoma, syncope, SVT, and hypertension is referred to the clinic for evaluation of anemia  The patient recently had a routine annual wellness visit performed on 12/13/2019.  It was noted on routine blood work that her hemoglobin was slightly low at 10.7.  Per reviewing records, it appeared that the patient had mildly low anemia for several years (around 3-4 years).  To the patient's knowledge, she has been anemic for 3-4 years as well as during pregnancy several decades ago in which she required iron infusions. She was subsequently referred to the clinic today for evaluation and recommendations regarding her anemia.  The patient had a GI work-up about 4 years ago by Dr. Michail Sermon. Her workup was negative for a source of anemia. She stays up to date with her colonoscopies due to her family history of CRC. Her last colonoscopy noted polyps. She is due for her next colonoscopy next year. She did not have an endoscopy at that time. She also recently had stool cards performed x1 about 2-3 months ago which were negative for occult blood. The patient had some nausea yesterday. She denies  vomiting, diarrhea, constipation.  She denies any history of celiac disease.  She denies hemorrhoids. She denies any prior gastric surgery. She denies PPI use. She does endorse more bloating with certain foods the last 8 months or so. She denies any abnormal bleeding or bruising including epistaxis, gingival bleeding, melena, hematochezia, hemoptysis, or hematuria.  She denies any particular diet dietary habit such as being a vegan or vegetarian; however, she eats red meat only a "few" times per month due to preference.  She denies any nutrient  deficiencies in her performed iron studies on her recently which were within normal limits. It does not appear that ferritin was drawn. She denies taking any blood thinners.  She denies NSAID use.  She denies any fever, chills, night sweats, or unexpected weight loss.  She denies any abnormal uterine/vaginal bleeding (she is status post hysterectomy ~35 years ago).  She denies any headaches, lightheadedness, chest pain, or dyspnea on exertion.  She is supposed to be following up with a cardiologist soon for palpitations. She has a history of SVT and is status post ablation.  She denies any kidney disease.  The patient denies any family history of anemia, sickle cell anemia, or thalassemia.  She denies any family history of hematologic malignancies.  The patient's father had colorectal cancer.  The patient's mother had cardiomyopathy, Alzheimer's, and rheumatoid arthritis.  The patient has 3 brothers and 1 sister with diabetes.  The patient has 2 brothers with pancreatic cancer.  The patient also has 2 brothers and a sister with heart disease.  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  The patient is married and has 2 children.  She rarely drinks alcohol and states that she had 2 ounces of alcohol earlier this week which was the first alcoholic beverage she has had in approximately 1 year.  She is a former light smoker.  She quit smoking approximately 40 years ago.  She smoked 5 to 6 years estimating that 1 pack of cigarettes would last for a week.  She denies any drug use.   HPI  Past  Medical History:  Diagnosis Date  . Hypertension     Past Surgical History:  Procedure Laterality Date  . ABDOMINAL HYSTERECTOMY    . CESAREAN SECTION    . REDUCTION MAMMAPLASTY Bilateral 2003  . SVT ABLATION N/A 05/11/2017   Procedure: SVT ABLATION;  Surgeon: Evans Lance, MD;  Location: [facility];  Service: Cardiovascular;  Laterality: N/A;    Family History  Problem  Relation Age of Onset  . Arrhythmia Mother   . Arrhythmia Sister     Social History Social History   Tobacco Use  . Smoking status: Former Research scientist (life sciences)  . Smokeless tobacco: Never Used  Vaping Use  . Vaping Use: Never used  Substance Use Topics  . Alcohol use: No  . Drug use: No    Allergies  Allergen Reactions  . Combigan [Brimonidine Tartrate-Timolol] Other (See Comments)    lethargy    Current Outpatient Medications  Medication Sig Dispense Refill  . acetaminophen (TYLENOL) 650 MG CR tablet Take 1,300 mg by mouth daily.    Marland Kitchen atenolol (TENORMIN) 50 MG tablet Take 50 mg by mouth daily.    . brimonidine (ALPHAGAN) 0.2 % ophthalmic solution     . brinzolamide (AZOPT) 1 % ophthalmic suspension Place 1 drop into both eyes 3 (three) times daily.     . folic acid (FOLVITE) 1 MG tablet Take 2-3 mg by mouth See admin instructions. Take 3 mg by mouth daily on Thursday, Friday and Saturday. Take 2 mg by mouth daily on all other days.    . hydrochlorothiazide (HYDRODIURIL) 12.5 MG tablet Take 12.5 mg by mouth every morning.    . meloxicam (MOBIC) 15 MG tablet Take 15 mg by mouth daily.    . methotrexate (RHEUMATREX) 2.5 MG tablet Take 17.5 mg by mouth every Friday.   0  . timolol (BETIMOL) 0.25 % ophthalmic solution Place 1 drop into both eyes 2 (two) times daily.    . timolol (TIMOPTIC) 0.5 % ophthalmic solution     . traMADol (ULTRAM) 50 MG tablet Take by mouth.    . Travoprost, BAK Free, (TRAVATAN) 0.004 % SOLN ophthalmic solution     . triamcinolone cream (KENALOG) 0.1 % Apply 1 application topically 2 (two) times daily as needed (for ecsema).   0   No current facility-administered medications for this visit.    REVIEW OF SYSTEMS:   Review of Systems  Constitutional: Negative for appetite change, chills, fatigue, fever and unexpected weight change.  HENT: Negative for mouth sores, nosebleeds, sore throat and trouble swallowing.   Eyes: Negative for eye problems and icterus.    Respiratory: Negative for cough, hemoptysis, shortness of breath and wheezing.   Cardiovascular: Negative for chest pain and leg swelling.  Gastrointestinal: Positive for mild nausea yesterday and abdominal bloating. Negative for abdominal pain, constipation, diarrhea, and vomiting.  Genitourinary: Negative for bladder incontinence, difficulty urinating, dysuria, frequency and hematuria.   Musculoskeletal: Negative for back pain, gait problem, neck pain and neck stiffness.  Skin: Negative for itching and rash.  Neurological: Negative for dizziness, extremity weakness, gait problem, headaches, light-headedness and seizures.  Hematological: Negative for adenopathy. Does not bruise/bleed easily.  Psychiatric/Behavioral: Negative for confusion, depression and sleep disturbance. The patient is not nervous/anxious.     PHYSICAL EXAMINATION:  Blood pressure 135/84, pulse 63, temperature 98.5 F (36.9 C), temperature source Tympanic, resp. rate 17, height 5' 4"  (1.626 m), weight 194 lb 12.8 oz (88.4 kg), SpO2 100 %.  ECOG PERFORMANCE STATUS: 0  Physical Exam  Constitutional: Oriented to person, place, and time and well-developed, well-nourished, and in no distress.  HENT:  Head: Normocephalic and atraumatic.  Mouth/Throat: Oropharynx is clear and moist. No oropharyngeal exudate.  Eyes: Conjunctivae are normal. Right eye exhibits no discharge. Left eye exhibits no discharge. No scleral icterus.  Neck: Normal range of motion. Neck supple.  Cardiovascular: Normal rate, regular rhythm, normal heart sounds and intact distal pulses.   Pulmonary/Chest: Effort normal and breath sounds normal. No respiratory distress. No wheezes. No rales.  Abdominal: Soft. Bowel sounds are normal. Exhibits no distension and no mass. There is no tenderness.  Musculoskeletal: Normal range of motion. Exhibits no edema.  Lymphadenopathy:    No cervical adenopathy.  Neurological: Alert and oriented to person, place, and  time. Exhibits normal muscle tone. Gait normal. Coordination normal.  Skin: Skin is warm and dry. No rash noted. Not diaphoretic. No erythema. No pallor.  Psychiatric: Mood, memory and judgment normal.  Vitals reviewed.  LABORATORY DATA: Lab Results  Component Value Date   WBC 5.5 01/12/2020   HGB 10.9 (L) 01/12/2020   HCT 35.1 (L) 01/12/2020   MCV 89.5 01/12/2020   PLT 318 01/12/2020      Chemistry      Component Value Date/Time   NA 136 01/12/2020 1225   NA 143 05/04/2017 0118   K 4.0 01/12/2020 1225   CL 103 01/12/2020 1225   CO2 27 01/12/2020 1225   BUN 19 01/12/2020 1225   BUN 21 05/04/2017 0118   CREATININE 1.21 (H) 01/12/2020 1225      Component Value Date/Time   CALCIUM 9.8 01/12/2020 1225   ALKPHOS 76 01/12/2020 1225   AST 21 01/12/2020 1225   ALT 14 01/12/2020 1225   BILITOT 0.4 01/12/2020 1225       RADIOGRAPHIC STUDIES: No results found.  ASSESSMENT: This is a very pleasant 64 year old African-American female referred to the clinic for evaluation of anemia   PLAN: The patient was seen with Dr. Julien Nordmann today.  The patient several lab studies performed today including a CBC, CMP, LDH, iron studies, ferritin, vitamin B12, SPEP with immunofixation, and folic acid.  The patient CBC from today demonstrates a Hbg of 10.9. Her CMP was unremarkable except a creatinine of 1.21. Her iron studies are unremarkable. Her ferritin is slightly elevated at 315 likely due to her chronic diseases/inflammatory processes such as with her rheumatoid arthritis. Her other lab studies are pending.   I will personally call the patient back with the results of her lab studies. If no clear etiology seen, we will likely arrange for the patient to return in 2-3 weeks to review the results and further recommendations for evaluation. If no other etiology for her anemia can be found, we may consider a bone marrow biopsy and aspirate; however, we will hold off on that for now.   The  patient voices understanding of current disease status and treatment options and is in agreement with the current care plan.  All questions were answered. The patient knows to call the clinic with any problems, questions or concerns. We can certainly see the patient much sooner if necessary.  Thank you so much for allowing me to participate in the care of Alvester Morin. I will continue to follow up the patient with you and assist in her care.  The total time spent in the appointment was 60 minutes.  Disclaimer: This note was dictated with voice recognition software. Similar sounding words can inadvertently be  transcribed and may not be corrected upon review.   Zelta Enfield L Naarah Borgerding January 12, 2020, 2:09 PM  ADDENDUM: Hematology/Oncology Attending: I had a face-to-face encounter with the patient today.  I recommended her care plan.  This is a very pleasant with history of rheumatoid arthritis and currently on treatment with methotrexate and Mobic.  The patient also has persistent mild anemia for several years.  She was seen by gastroenterology, Dr. Hubbard Hartshorn few years ago and the work-up for her anemia was unremarkable.  She is scheduled to have another colonoscopy next year because of family history of colorectal cancer. The patient was referred to [place] today for evaluation and recommendation regarding her persistent anemia. We repeated several lab work today including iron study and ferritin that were unremarkable but has elevated ferritin level.  Comprehensive metabolic panel showed mild renal insufficiency.  The patient has normal serum folate and vitamin B12 level. Serum protein electrophoresis is still pending. This is likely anemia of chronic disease secondary to her renal insufficiency as well as the rheumatoid arthritis.  If no clear etiology for her anemia from the remaining blood work, we may consider the patient for a bone marrow biopsy and aspirate to rule out any other  underlying etiology. The patient agreed to the current plan. She will come back for follow-up visit in 2 weeks for evaluation and discussion of her lab results and further recommendation regarding her condition. The patient was advised to call immediately if she has any concerning symptoms in the interval.  Disclaimer: This note was dictated with voice recognition software. Similar sounding words can inadvertently be transcribed and may be missed upon review. Eilleen Kempf, MD 01/13/20

## 2020-01-11 ENCOUNTER — Other Ambulatory Visit: Payer: Self-pay | Admitting: Physician Assistant

## 2020-01-11 ENCOUNTER — Other Ambulatory Visit: Payer: Self-pay | Admitting: Medical Oncology

## 2020-01-11 DIAGNOSIS — D649 Anemia, unspecified: Secondary | ICD-10-CM

## 2020-01-12 ENCOUNTER — Inpatient Hospital Stay: Payer: BC Managed Care – PPO | Attending: Physician Assistant | Admitting: Physician Assistant

## 2020-01-12 ENCOUNTER — Other Ambulatory Visit: Payer: Self-pay

## 2020-01-12 ENCOUNTER — Encounter: Payer: Self-pay | Admitting: Physician Assistant

## 2020-01-12 ENCOUNTER — Inpatient Hospital Stay: Payer: BC Managed Care – PPO

## 2020-01-12 DIAGNOSIS — Z87891 Personal history of nicotine dependence: Secondary | ICD-10-CM

## 2020-01-12 DIAGNOSIS — Z86010 Personal history of colonic polyps: Secondary | ICD-10-CM

## 2020-01-12 DIAGNOSIS — M069 Rheumatoid arthritis, unspecified: Secondary | ICD-10-CM

## 2020-01-12 DIAGNOSIS — D649 Anemia, unspecified: Secondary | ICD-10-CM

## 2020-01-12 DIAGNOSIS — R7989 Other specified abnormal findings of blood chemistry: Secondary | ICD-10-CM | POA: Insufficient documentation

## 2020-01-12 DIAGNOSIS — Z800 Family history of malignant neoplasm of digestive organs: Secondary | ICD-10-CM | POA: Insufficient documentation

## 2020-01-12 DIAGNOSIS — I10 Essential (primary) hypertension: Secondary | ICD-10-CM | POA: Diagnosis not present

## 2020-01-12 DIAGNOSIS — N289 Disorder of kidney and ureter, unspecified: Secondary | ICD-10-CM | POA: Diagnosis not present

## 2020-01-12 DIAGNOSIS — Z79899 Other long term (current) drug therapy: Secondary | ICD-10-CM | POA: Insufficient documentation

## 2020-01-12 LAB — CMP (CANCER CENTER ONLY)
ALT: 14 U/L (ref 0–44)
AST: 21 U/L (ref 15–41)
Albumin: 4.1 g/dL (ref 3.5–5.0)
Alkaline Phosphatase: 76 U/L (ref 38–126)
Anion gap: 6 (ref 5–15)
BUN: 19 mg/dL (ref 8–23)
CO2: 27 mmol/L (ref 22–32)
Calcium: 9.8 mg/dL (ref 8.9–10.3)
Chloride: 103 mmol/L (ref 98–111)
Creatinine: 1.21 mg/dL — ABNORMAL HIGH (ref 0.44–1.00)
GFR, Est AFR Am: 55 mL/min — ABNORMAL LOW (ref >60)
GFR, Estimated: 48 mL/min — ABNORMAL LOW (ref >60)
Glucose, Bld: 100 mg/dL — ABNORMAL HIGH (ref 70–99)
Potassium: 4 mmol/L (ref 3.5–5.1)
Sodium: 136 mmol/L (ref 135–145)
Total Bilirubin: 0.4 mg/dL (ref 0.3–1.2)
Total Protein: 7.6 g/dL (ref 6.5–8.1)

## 2020-01-12 LAB — CBC WITH DIFFERENTIAL (CANCER CENTER ONLY)
Abs Immature Granulocytes: 0.02 10*3/uL (ref 0.00–0.07)
Basophils Absolute: 0.1 10*3/uL (ref 0.0–0.1)
Basophils Relative: 1 %
Eosinophils Absolute: 0.2 10*3/uL (ref 0.0–0.5)
Eosinophils Relative: 3 %
HCT: 35.1 % — ABNORMAL LOW (ref 36.0–46.0)
Hemoglobin: 10.9 g/dL — ABNORMAL LOW (ref 12.0–15.0)
Immature Granulocytes: 0 %
Lymphocytes Relative: 34 %
Lymphs Abs: 1.9 10*3/uL (ref 0.7–4.0)
MCH: 27.8 pg (ref 26.0–34.0)
MCHC: 31.1 g/dL (ref 30.0–36.0)
MCV: 89.5 fL (ref 80.0–100.0)
Monocytes Absolute: 0.5 10*3/uL (ref 0.1–1.0)
Monocytes Relative: 9 %
Neutro Abs: 2.9 10*3/uL (ref 1.7–7.7)
Neutrophils Relative %: 53 %
Platelet Count: 318 10*3/uL (ref 150–400)
RBC: 3.92 MIL/uL (ref 3.87–5.11)
RDW: 14.6 % (ref 11.5–15.5)
WBC Count: 5.5 10*3/uL (ref 4.0–10.5)
nRBC: 0 % (ref 0.0–0.2)

## 2020-01-12 LAB — VITAMIN B12: Vitamin B-12: 588 pg/mL (ref 180–914)

## 2020-01-12 LAB — IRON AND TIBC
Iron: 64 ug/dL (ref 41–142)
Saturation Ratios: 26 % (ref 21–57)
TIBC: 248 ug/dL (ref 236–444)
UIBC: 184 ug/dL (ref 120–384)

## 2020-01-12 LAB — FERRITIN: Ferritin: 315 ng/mL — ABNORMAL HIGH (ref 11–307)

## 2020-01-12 LAB — FOLATE: Folate: >100 ng/mL (ref >5.9)

## 2020-01-13 LAB — PROTEIN ELECTROPHORESIS, SERUM, WITH REFLEX
A/G Ratio: 1.4 (ref 0.7–1.7)
Albumin ELP: 4.3 g/dL (ref 2.9–4.4)
Alpha-1-Globulin: 0.2 g/dL (ref 0.0–0.4)
Alpha-2-Globulin: 0.8 g/dL (ref 0.4–1.0)
Beta Globulin: 1.1 g/dL (ref 0.7–1.3)
Gamma Globulin: 1 g/dL (ref 0.4–1.8)
Globulin, Total: 3 g/dL (ref 2.2–3.9)
Total Protein ELP: 7.3 g/dL (ref 6.0–8.5)

## 2020-01-16 ENCOUNTER — Telehealth: Payer: Self-pay | Admitting: Physician Assistant

## 2020-01-16 ENCOUNTER — Telehealth: Payer: Self-pay | Admitting: *Deleted

## 2020-01-16 NOTE — Telephone Encounter (Signed)
-----   Message from Johnette Abraham Heilingoetter, PA-C sent at 01/16/2020  9:02 AM EDT ----- Can you call her and let her know her labs look unremarkable. I sent a scheduling message to see her back in about 2-3 weeks to go over it and what needs to be done next. Just let her know that someone from scheduling will probably call her as well to make a follow up appointment as well ----- Message ----- From: Interface, [facility] [place] Sent: 01/12/2020  12:40 PM EDT To: Johnette Abraham Heilingoetter, PA-C

## 2020-01-16 NOTE — Telephone Encounter (Signed)
Scheduled appt per 10/4 sch msg -unable to reach pt . Left message for patient with appt date and time   

## 2020-01-16 NOTE — Telephone Encounter (Signed)
LM with note below 

## 2020-01-31 NOTE — Progress Notes (Signed)
[facility] OFFICE PROGRESS NOTE  Kelton Pillar, MD [address] [place] [address]  DIAGNOSIS: Anemia of unclear etiology  PRIOR THERAPY: None  CURRENT THERAPY: None  INTERVAL HISTORY: Marissa Torres 64 y.o. female returns to the clinic today for a follow up visit. The patient was referred to the clinic a few weeks ago for further evaluation of anemia.  The patient had a GI work-up approximately 3 to 4 years ago which was negative for source of her anemia.  The patient has had persistent but mild anemia for approximately 4 years or so.  She denies any abnormal bleeding or bruising.  She denies any gastric surgery, PPI use, celiac disease, or hemorrhoids.  She denies NSAID use or blood thinner use.  Denies any significant lightheadedness or shortness of breath but reports baseline fatigue.  The patient had several lab studies performed including a CBC, CMP, folate, B12, ferritin, iron studies, stool cards, and an SPEP with immunofixation.  The patient is here today for evaluation and to review her lab results and for more detailed discussion about her current condition and recommended work-up.    MEDICAL HISTORY: Past Medical History:  Diagnosis Date  . Hypertension     ALLERGIES:  is allergic to [place] [brimonidine tartrate-timolol].  MEDICATIONS:  Current Outpatient Medications  Medication Sig Dispense Refill  . acetaminophen (TYLENOL) 650 MG CR tablet Take 1,300 mg by mouth daily.    Marland Kitchen atenolol (TENORMIN) 50 MG tablet Take 50 mg by mouth daily.    . brimonidine (ALPHAGAN) 0.2 % ophthalmic solution     . brinzolamide (AZOPT) 1 % ophthalmic suspension Place 1 drop into both eyes 3 (three) times daily.     . folic acid (FOLVITE) 1 MG tablet Take 2-3 mg by mouth See admin instructions. Take 3 mg by mouth daily on Thursday, Friday and Saturday. Take 2 mg by mouth daily on all other days.    . hydrochlorothiazide (HYDRODIURIL) 12.5 MG tablet Take  12.5 mg by mouth every morning.    . meloxicam (MOBIC) 15 MG tablet Take 15 mg by mouth daily.    . methotrexate (RHEUMATREX) 2.5 MG tablet Take 17.5 mg by mouth every Friday.   0  . timolol (BETIMOL) 0.25 % ophthalmic solution Place 1 drop into both eyes 2 (two) times daily.    . timolol (TIMOPTIC) 0.5 % ophthalmic solution     . traMADol (ULTRAM) 50 MG tablet Take by mouth.    . Travoprost, BAK Free, (TRAVATAN) 0.004 % SOLN ophthalmic solution     . triamcinolone cream (KENALOG) 0.1 % Apply 1 application topically 2 (two) times daily as needed (for ecsema).   0   No current facility-administered medications for this visit.    SURGICAL HISTORY:  Past Surgical History:  Procedure Laterality Date  . ABDOMINAL HYSTERECTOMY    . CESAREAN SECTION    . REDUCTION MAMMAPLASTY Bilateral 2003  . SVT ABLATION N/A 05/11/2017   Procedure: SVT ABLATION;  Surgeon: Evans Lance, MD;  Location: [facility];  Service: Cardiovascular;  Laterality: N/A;    REVIEW OF SYSTEMS:   Review of Systems  Constitutional: Positive for fatigue. Negative for appetite change, chills, fever and unexpected weight change.  HENT: Negative for mouth sores, nosebleeds, sore throat and trouble swallowing.   Eyes: Negative for eye problems and icterus.  Respiratory: Negative for cough, hemoptysis, shortness of breath and wheezing.   Cardiovascular: Negative for chest pain and leg  swelling.  Gastrointestinal:  Negative for abdominal pain, nausea, constipation, diarrhea, and vomiting.  Genitourinary: Negative for bladder incontinence, difficulty urinating, dysuria, frequency and hematuria.   Musculoskeletal: Negative for back pain, gait problem, neck pain and neck stiffness.  Skin: Negative for itching and rash.  Neurological: Negative for dizziness, extremity weakness, gait problem, headaches, light-headedness and seizures.  Hematological: Negative for adenopathy. Does not bruise/bleed easily.   Psychiatric/Behavioral: Negative for confusion, depression and sleep disturbance. The patient is not nervous/anxious.     PHYSICAL EXAMINATION:  Blood pressure 117/75, pulse 66, temperature (!) 97.3 F (36.3 C), temperature source Tympanic, resp. rate 20, height 5' 4"  (1.626 m), weight 193 lb 12.8 oz (87.9 kg), SpO2 100 %.  ECOG PERFORMANCE STATUS: 0 - Asymptomatic  Physical Exam  Constitutional: Oriented to person, place, and time and well-developed, well-nourished, and in no distress.  HENT:  Head: Normocephalic and atraumatic.  Mouth/Throat: Oropharynx is clear and moist. No oropharyngeal exudate.  Eyes: Conjunctivae are normal. Right eye exhibits no discharge. Left eye exhibits no discharge. No scleral icterus.  Neck: Normal range of motion. Neck supple.  Cardiovascular: Normal rate, regular rhythm, normal heart sounds and intact distal pulses.   Pulmonary/Chest: Effort normal and breath sounds normal. No respiratory distress. No wheezes. No rales.  Abdominal: Soft. Bowel sounds are normal. Exhibits no distension and no mass. There is no tenderness.  Musculoskeletal: Normal range of motion. Exhibits no edema.  Lymphadenopathy:    No cervical adenopathy.  Neurological: Alert and oriented to person, place, and time. Exhibits normal muscle tone. Gait normal. Coordination normal.  Skin: Skin is warm and dry. No rash noted. Not diaphoretic. No erythema. No pallor.  Psychiatric: Mood, memory and judgment normal.  Vitals reviewed.   LABORATORY DATA: Lab Results  Component Value Date   WBC 6.0 02/02/2020   HGB 11.2 (L) 02/02/2020   HCT 35.2 (L) 02/02/2020   MCV 89.8 02/02/2020   PLT 276 02/02/2020      Chemistry      Component Value Date/Time   NA 136 01/12/2020 1225   NA 143 05/04/2017 0118   K 4.0 01/12/2020 1225   CL 103 01/12/2020 1225   CO2 27 01/12/2020 1225   BUN 19 01/12/2020 1225   BUN 21 05/04/2017 0118   CREATININE 1.21 (H) 01/12/2020 1225      Component  Value Date/Time   CALCIUM 9.8 01/12/2020 1225   ALKPHOS 76 01/12/2020 1225   AST 21 01/12/2020 1225   ALT 14 01/12/2020 1225   BILITOT 0.4 01/12/2020 1225       RADIOGRAPHIC STUDIES:  No results found.   ASSESSMENT/PLAN:  This is a very pleasant 64 year old African-American female referred to the clinic for evaluation of anemia.  The patient was seen with Dr. Julien Nordmann today.  Dr. Julien Nordmann reviewed the results of her CBC, CMP, LDH, iron studies, ferritin, Z22, SPEP, and folic acid.  The patient's lab studies were unremarkable for an etiology of her anemia.  The patient's ferritin was slightly elevated likely secondary to inflammation possibly due to her rheumatoid arthritis.  Dr. Julien Nordmann discussed that the other option for further work-up would include a bone marrow biopsy and aspirate; however, her anemia is mild today with a Hbg of 11.2 and he does not feel that an invasive procedure is necessary for her mild anemia.   Dr. Julien Nordmann recommends that the patient continue on observation with a repeat CBC, iron studies, and ferritin in 6 months. She will be due for her next  colonoscopy in about 6 months or so.   She was encouraged to take a one a day women's multivitamin.   The patient was advised to call immediately if she has any concerning symptoms in the interval. The patient voices understanding of current disease status and treatment options and is in agreement with the current care plan. All questions were answered. The patient knows to call the clinic with any problems, questions or concerns. We can certainly see the patient much sooner if necessary    Orders Placed This Encounter  Procedures  . Ferritin    Standing Status:   Future    Standing Expiration Date:   02/01/2021  . Iron and TIBC    Standing Status:   Future    Standing Expiration Date:   02/01/2021  . CBC with Differential (Cancer Center Only)    Standing Status:   Future    Standing Expiration Date:    02/01/2021     Tobe Sos Nychelle Cassata, PA-C 02/02/20  ADDENDUM: Hematology/Oncology Attending: I had a face-to-face encounter with the patient.  I recommended her care plan.  This is a very pleasant 64 years old African-American female with persistent anemia of unclear etiology, likely anemia of chronic disease.  The patient underwent several studies to evaluate the etiology of her anemia and these were unremarkable.  Her anemia is mild and the patient has mild fatigue. I recommended for the patient to continue with multivitamins. We will see her back for follow-up visit in 6 months for evaluation with repeat CBC, iron study and ferritin. She was advised to call immediately if she has any concerning symptoms in the interval.  Disclaimer: This note was dictated with voice recognition software. Similar sounding words can inadvertently be transcribed and may be missed upon review. Marissa Kempf, MD 02/04/20

## 2020-02-02 ENCOUNTER — Encounter: Payer: Self-pay | Admitting: Physician Assistant

## 2020-02-02 ENCOUNTER — Inpatient Hospital Stay: Payer: BC Managed Care – PPO

## 2020-02-02 ENCOUNTER — Other Ambulatory Visit: Payer: Self-pay

## 2020-02-02 ENCOUNTER — Inpatient Hospital Stay: Payer: BC Managed Care – PPO | Attending: Physician Assistant | Admitting: Physician Assistant

## 2020-02-02 VITALS — BP 117/75 | HR 66 | Temp 97.3°F | Resp 20 | Ht 64.0 in | Wt 193.8 lb

## 2020-02-02 DIAGNOSIS — D649 Anemia, unspecified: Secondary | ICD-10-CM

## 2020-02-02 DIAGNOSIS — Z79899 Other long term (current) drug therapy: Secondary | ICD-10-CM | POA: Diagnosis not present

## 2020-02-02 DIAGNOSIS — I10 Essential (primary) hypertension: Secondary | ICD-10-CM | POA: Insufficient documentation

## 2020-02-02 LAB — CBC WITH DIFFERENTIAL (CANCER CENTER ONLY)
Abs Immature Granulocytes: 0.01 10*3/uL (ref 0.00–0.07)
Basophils Absolute: 0.1 10*3/uL (ref 0.0–0.1)
Basophils Relative: 1 %
Eosinophils Absolute: 0.1 10*3/uL (ref 0.0–0.5)
Eosinophils Relative: 2 %
HCT: 35.2 % — ABNORMAL LOW (ref 36.0–46.0)
Hemoglobin: 11.2 g/dL — ABNORMAL LOW (ref 12.0–15.0)
Immature Granulocytes: 0 %
Lymphocytes Relative: 41 %
Lymphs Abs: 2.5 10*3/uL (ref 0.7–4.0)
MCH: 28.6 pg (ref 26.0–34.0)
MCHC: 31.8 g/dL (ref 30.0–36.0)
MCV: 89.8 fL (ref 80.0–100.0)
Monocytes Absolute: 0.5 10*3/uL (ref 0.1–1.0)
Monocytes Relative: 8 %
Neutro Abs: 2.9 10*3/uL (ref 1.7–7.7)
Neutrophils Relative %: 48 %
Platelet Count: 276 10*3/uL (ref 150–400)
RBC: 3.92 MIL/uL (ref 3.87–5.11)
RDW: 14.3 % (ref 11.5–15.5)
WBC Count: 6 10*3/uL (ref 4.0–10.5)
nRBC: 0 % (ref 0.0–0.2)

## 2020-02-13 ENCOUNTER — Ambulatory Visit: Payer: BC Managed Care – PPO | Admitting: Podiatry

## 2020-02-28 ENCOUNTER — Encounter: Payer: Self-pay | Admitting: Podiatry

## 2020-02-28 ENCOUNTER — Other Ambulatory Visit: Payer: Self-pay

## 2020-02-28 ENCOUNTER — Ambulatory Visit (INDEPENDENT_AMBULATORY_CARE_PROVIDER_SITE_OTHER): Payer: BC Managed Care – PPO | Admitting: Podiatry

## 2020-02-28 DIAGNOSIS — M79675 Pain in left toe(s): Secondary | ICD-10-CM | POA: Diagnosis not present

## 2020-02-28 DIAGNOSIS — M79674 Pain in right toe(s): Secondary | ICD-10-CM | POA: Diagnosis not present

## 2020-02-28 DIAGNOSIS — B351 Tinea unguium: Secondary | ICD-10-CM | POA: Diagnosis not present

## 2020-03-02 NOTE — Progress Notes (Signed)
Subjective: Marissa Torres presents today for follow up of painful mycotic nails b/l that are difficult to trim. Pain interferes with ambulation. Aggravating factors include wearing enclosed shoe gear. Pain is relieved with periodic professional debridement.   She continues to work out at the [employer] several times per week and really enjoys it.  She voices no new pedal problems on today's visit.  Allergies  Allergen Reactions  . Combigan [Brimonidine Tartrate-Timolol] Other (See Comments)    lethargy     Objective: There were no vitals filed for this visit.  Vascular Examination:  Capillary refill time to digits immediate b/l, palpable DP pulses b/l, palpable PT pulses b/l, pedal hair present b/l and skin temperature gradient within normal limits b/l  Dermatological Examination: Pedal skin with normal turgor, texture and tone bilaterally, no open wounds bilaterally, no interdigital macerations bilaterally and toenails 1-5 b/l elongated, dystrophic, thickened, crumbly with subungual debris  Musculoskeletal: Normal muscle strength 5/5 to all lower extremity muscle groups bilaterally, no pain crepitus or joint limitation noted with ROM b/l and bunion deformity noted b/l  Neurological: Protective sensation intact 5/5 intact bilaterally with 10g monofilament b/l and vibratory sensation intact b/l  Assessment: 1. Pain due to onychomycosis of toenails of both feet    Plan: -No new findings. No new orders. -Toenails 1-5 b/l were debrided in length and girth with sterile nail nippers and dremel without iatrogenic bleeding. -Patient to continue soft, supportive shoe gear daily. -Patient to report any pedal injuries to medical professional immediately. -Patient/POA to call should there be question/concern in the interim.  Return in about 3 months (around 05/30/2020).

## 2020-06-05 ENCOUNTER — Ambulatory Visit: Payer: BC Managed Care – PPO | Admitting: Podiatry

## 2020-06-05 ENCOUNTER — Encounter: Payer: Self-pay | Admitting: Podiatry

## 2020-06-05 ENCOUNTER — Other Ambulatory Visit: Payer: Self-pay

## 2020-06-05 DIAGNOSIS — M5430 Sciatica, unspecified side: Secondary | ICD-10-CM | POA: Insufficient documentation

## 2020-06-05 DIAGNOSIS — B351 Tinea unguium: Secondary | ICD-10-CM | POA: Diagnosis not present

## 2020-06-05 DIAGNOSIS — M79675 Pain in left toe(s): Secondary | ICD-10-CM

## 2020-06-05 DIAGNOSIS — Z800 Family history of malignant neoplasm of digestive organs: Secondary | ICD-10-CM | POA: Insufficient documentation

## 2020-06-05 DIAGNOSIS — M79674 Pain in right toe(s): Secondary | ICD-10-CM

## 2020-06-09 NOTE — Progress Notes (Signed)
Subjective: Marissa Torres presents today for follow up of painful mycotic nails b/l that are difficult to trim. Pain interferes with ambulation. Aggravating factors include wearing enclosed shoe gear. Pain is relieved with periodic professional debridement.   She states her right 3rd digit nailplate became loose and fell off. Denies any redness, drainage, swelling or pain. She voices no new pedal problems on today's visit.  Allergies  Allergen Reactions  . Combigan [Brimonidine Tartrate-Timolol] Other (See Comments)    lethargy     Objective: There were no vitals filed for this visit.  Vascular Examination:  Capillary refill time to digits immediate b/l, palpable DP pulses b/l, palpable PT pulses b/l, pedal hair present b/l and skin temperature gradient within normal limits b/l  Dermatological Examination: Pedal skin with normal turgor, texture and tone bilaterally. No open wounds bilaterally. No interdigital macerations bilaterally. Toenails 1-5 left, R hallux, R 2nd toe, R 4th toe and R 5th toe elongated, discolored, dystrophic, thickened, and crumbly with subungual debris and tenderness to dorsal palpation. Anonychia noted R 3rd toe. Nailbed(s) epithelialized.   Musculoskeletal: Normal muscle strength 5/5 to all lower extremity muscle groups bilaterally, no pain crepitus or joint limitation noted with ROM b/l and bunion deformity noted b/l  Neurological: Protective sensation intact 5/5 intact bilaterally with 10g monofilament b/l and vibratory sensation intact b/l  Assessment: 1. Pain due to onychomycosis of toenails of both feet    Plan: -No new findings. No new orders. -Toenails 1-5 left, R hallux, 2, 4, 5 were debrided in length and girth with sterile nail nippers and dremel without iatrogenic bleeding. -Patient to continue soft, supportive shoe gear daily. -Patient to report any pedal injuries to medical professional immediately. -Patient/POA to call should there be  question/concern in the interim.  Return in about 3 months (around 09/02/2020).

## 2020-06-13 ENCOUNTER — Telehealth: Payer: Self-pay | Admitting: Internal Medicine

## 2020-06-13 NOTE — Telephone Encounter (Signed)
Rescheduled appointment per patient request. Patient will be out of town 04/12-end of month. Patient is aware of new appointment time.

## 2020-07-19 ENCOUNTER — Inpatient Hospital Stay: Payer: BC Managed Care – PPO | Attending: Internal Medicine

## 2020-07-19 ENCOUNTER — Inpatient Hospital Stay (HOSPITAL_BASED_OUTPATIENT_CLINIC_OR_DEPARTMENT_OTHER): Payer: BC Managed Care – PPO | Admitting: Internal Medicine

## 2020-07-19 ENCOUNTER — Encounter: Payer: Self-pay | Admitting: Internal Medicine

## 2020-07-19 ENCOUNTER — Other Ambulatory Visit: Payer: Self-pay

## 2020-07-19 VITALS — BP 140/84 | HR 68 | Temp 97.3°F | Resp 17 | Ht 64.0 in | Wt 200.1 lb

## 2020-07-19 DIAGNOSIS — I10 Essential (primary) hypertension: Secondary | ICD-10-CM | POA: Diagnosis not present

## 2020-07-19 DIAGNOSIS — D649 Anemia, unspecified: Secondary | ICD-10-CM

## 2020-07-19 DIAGNOSIS — R5383 Other fatigue: Secondary | ICD-10-CM | POA: Diagnosis not present

## 2020-07-19 DIAGNOSIS — D631 Anemia in chronic kidney disease: Secondary | ICD-10-CM

## 2020-07-19 DIAGNOSIS — N189 Chronic kidney disease, unspecified: Secondary | ICD-10-CM | POA: Diagnosis not present

## 2020-07-19 LAB — IRON AND TIBC
Iron: 62 ug/dL (ref 41–142)
Saturation Ratios: 26 % (ref 21–57)
TIBC: 244 ug/dL (ref 236–444)
UIBC: 181 ug/dL (ref 120–384)

## 2020-07-19 LAB — CBC WITH DIFFERENTIAL (CANCER CENTER ONLY)
Abs Immature Granulocytes: 0.02 10*3/uL (ref 0.00–0.07)
Basophils Absolute: 0.1 10*3/uL (ref 0.0–0.1)
Basophils Relative: 2 %
Eosinophils Absolute: 0.1 10*3/uL (ref 0.0–0.5)
Eosinophils Relative: 3 %
HCT: 33.2 % — ABNORMAL LOW (ref 36.0–46.0)
Hemoglobin: 10.8 g/dL — ABNORMAL LOW (ref 12.0–15.0)
Immature Granulocytes: 1 %
Lymphocytes Relative: 34 %
Lymphs Abs: 1.4 10*3/uL (ref 0.7–4.0)
MCH: 29.3 pg (ref 26.0–34.0)
MCHC: 32.5 g/dL (ref 30.0–36.0)
MCV: 90.2 fL (ref 80.0–100.0)
Monocytes Absolute: 0.4 10*3/uL (ref 0.1–1.0)
Monocytes Relative: 9 %
Neutro Abs: 2 10*3/uL (ref 1.7–7.7)
Neutrophils Relative %: 51 %
Platelet Count: 238 10*3/uL (ref 150–400)
RBC: 3.68 MIL/uL — ABNORMAL LOW (ref 3.87–5.11)
RDW: 13.9 % (ref 11.5–15.5)
WBC Count: 4 10*3/uL (ref 4.0–10.5)
nRBC: 0 % (ref 0.0–0.2)

## 2020-07-19 LAB — FERRITIN: Ferritin: 248 ng/mL (ref 11–307)

## 2020-07-19 NOTE — Progress Notes (Signed)
[facility] [phone]   [phone]  OFFICE PROGRESS NOTE  Maurice Small, MD [address] [place] [address]  DIAGNOSIS: Mild normocytic anemia likely anemia of chronic disease.  PRIOR THERAPY: None  CURRENT THERAPY: Over-the-counter multivitamins 1 tablet daily.  INTERVAL HISTORY: Marissa Torres 65 y.o. female returns to the clinic today for follow-up visit.  The patient is feeling fine today with no concerning complaints except for mild fatigue.  She denied having any current chest pain, shortness of breath, cough or hemoptysis.  She denied having any fever or chills.  She has no nausea, vomiting, diarrhea or constipation.  She has no headache or visual changes.  She is here today for evaluation and repeat blood work.  MEDICAL HISTORY: Past Medical History:  Diagnosis Date  . Hypertension     ALLERGIES:  is allergic to [place] [brimonidine tartrate-timolol].  MEDICATIONS:  Current Outpatient Medications  Medication Sig Dispense Refill  . acetaminophen (TYLENOL) 650 MG CR tablet Take 1,300 mg by mouth daily.    Marland Kitchen atenolol (TENORMIN) 50 MG tablet Take 50 mg by mouth daily.    . brimonidine (ALPHAGAN) 0.2 % ophthalmic solution     . brinzolamide (AZOPT) 1 % ophthalmic suspension Place 1 drop into both eyes 3 (three) times daily.     . folic acid (FOLVITE) 1 MG tablet Take 2-3 mg by mouth See admin instructions. Take 3 mg by mouth daily on Thursday, Friday and Saturday. Take 2 mg by mouth daily on all other days.    . hydrochlorothiazide (HYDRODIURIL) 12.5 MG tablet Take 12.5 mg by mouth every morning.    . meloxicam (MOBIC) 15 MG tablet Take 15 mg by mouth daily.    . methotrexate (RHEUMATREX) 2.5 MG tablet Take 17.5 mg by mouth every Friday.   0  . timolol (BETIMOL) 0.25 % ophthalmic solution Place 1 drop into both eyes 2 (two) times daily.    . timolol (TIMOPTIC) 0.5 % ophthalmic solution     . traMADol (ULTRAM)  50 MG tablet Take by mouth.    . Travoprost, BAK Free, (TRAVATAN) 0.004 % SOLN ophthalmic solution     . triamcinolone cream (KENALOG) 0.1 % Apply 1 application topically 2 (two) times daily as needed (for ecsema).   0   No current facility-administered medications for this visit.    SURGICAL HISTORY:  Past Surgical History:  Procedure Laterality Date  . ABDOMINAL HYSTERECTOMY    . CESAREAN SECTION    . REDUCTION MAMMAPLASTY Bilateral 2003  . SVT ABLATION N/A 05/11/2017   Procedure: SVT ABLATION;  Surgeon: Marinus Maw, MD;  Location: [facility];  Service: Cardiovascular;  Laterality: N/A;    REVIEW OF SYSTEMS:  A comprehensive review of systems was negative except for: Constitutional: positive for fatigue   PHYSICAL EXAMINATION: General appearance: alert, cooperative, fatigued and no distress Head: Normocephalic, without obvious abnormality, atraumatic Neck: no adenopathy, no JVD, supple, symmetrical, trachea midline and thyroid not enlarged, symmetric, no tenderness/mass/nodules Lymph nodes: Cervical, supraclavicular, and axillary nodes normal. Resp: clear to auscultation bilaterally Back: symmetric, no curvature. ROM normal. No CVA tenderness. Cardio: regular rate and rhythm, S1, S2 normal, no murmur, click, rub or gallop GI: soft, non-tender; bowel sounds normal; no masses,  no organomegaly Extremities: extremities normal, atraumatic, no cyanosis or edema  ECOG PERFORMANCE STATUS: 1 - Symptomatic but completely ambulatory  Blood pressure 140/84, pulse 68, temperature (!) 97.3 F (36.3 C),  temperature source Tympanic, resp. rate 17, height 5\' 4"  (1.626 m), weight 200 lb 1.6 oz (90.8 kg), SpO2 98 %.  LABORATORY DATA: Lab Results  Component Value Date   WBC 6.0 02/02/2020   HGB 11.2 (L) 02/02/2020   HCT 35.2 (L) 02/02/2020   MCV 89.8 02/02/2020   PLT 276 02/02/2020      Chemistry      Component Value Date/Time   NA 136 01/12/2020 1225   NA 143 05/04/2017  0118   K 4.0 01/12/2020 1225   CL 103 01/12/2020 1225   CO2 27 01/12/2020 1225   BUN 19 01/12/2020 1225   BUN 21 05/04/2017 0118   CREATININE 1.21 (H) 01/12/2020 1225      Component Value Date/Time   CALCIUM 9.8 01/12/2020 1225   ALKPHOS 76 01/12/2020 1225   AST 21 01/12/2020 1225   ALT 14 01/12/2020 1225   BILITOT 0.4 01/12/2020 1225       RADIOGRAPHIC STUDIES: No results found.  ASSESSMENT AND PLAN: This is a very pleasant 65 years old African-American female with mild normocytic anemia likely anemia of chronic disease secondary to renal insufficiency. The patient is currently on multivitamins and tolerating it fairly well. She had repeat CBC today that showed similar persistent mild anemia.  Iron study and ferritin are still pending. I recommended for the patient to continue her current treatment with over-the-counter multivitamins. I will see her back for follow-up visit in 6 months for evaluation and repeat blood work unless the iron study showed any concerning iron deficiency, I will consider her for iron infusion. The patient was advised to call immediately if she has any other concerning symptoms in the interval. The patient voices understanding of current disease status and treatment options and is in agreement with the current care plan.  All questions were answered. The patient knows to call the clinic with any problems, questions or concerns. We can certainly see the patient much sooner if necessary.  The total time spent in the appointment was 15 minutes.  Disclaimer: This note was dictated with voice recognition software. Similar sounding words can inadvertently be transcribed and may not be corrected upon review.

## 2020-07-20 ENCOUNTER — Telehealth: Payer: Self-pay | Admitting: Internal Medicine

## 2020-07-20 NOTE — Telephone Encounter (Signed)
Scheduled per los. Called and spoke with patient. Confirmed appt 

## 2020-07-23 ENCOUNTER — Other Ambulatory Visit (HOSPITAL_BASED_OUTPATIENT_CLINIC_OR_DEPARTMENT_OTHER): Payer: Self-pay

## 2020-07-23 ENCOUNTER — Other Ambulatory Visit: Payer: Self-pay

## 2020-07-23 ENCOUNTER — Ambulatory Visit: Payer: BC Managed Care – PPO | Attending: Internal Medicine

## 2020-07-23 DIAGNOSIS — Z23 Encounter for immunization: Secondary | ICD-10-CM

## 2020-07-23 NOTE — Progress Notes (Signed)
   Covid-19 Vaccination Clinic  Name:  Marissa Torres    MRN: [id number] DOB: 17-Apr-1955  07/23/2020  [name] was observed post Covid-19 immunization for 15 minutes without incident. She was provided with Vaccine Information Sheet and instruction to access the V-Safe system.   [name] was instructed to call 911 with any severe reactions post vaccine: Marland Kitchen Difficulty breathing  . Swelling of face and throat  . A fast heartbeat  . A bad rash all over body  . Dizziness and weakness   Immunizations Administered    Name Date Dose VIS Date Route   PFIZER Comrnaty(Gray TOP) Covid-19 Vaccine 07/23/2020  9:54 AM 0.3 mL 03/22/2020 Intramuscular   Manufacturer: [employer], [employer]   Lot: [id number]   NDC: [id number]

## 2020-07-24 ENCOUNTER — Other Ambulatory Visit (HOSPITAL_BASED_OUTPATIENT_CLINIC_OR_DEPARTMENT_OTHER): Payer: Self-pay

## 2020-07-24 MED ORDER — COVID-19 MRNA VACCINE (PFIZER) 30 MCG/0.3ML IM SUSP
INTRAMUSCULAR | 0 refills | Status: AC
  Filled 2020-07-24: qty 0.3, 1d supply, fill #0

## 2020-07-26 ENCOUNTER — Other Ambulatory Visit (HOSPITAL_BASED_OUTPATIENT_CLINIC_OR_DEPARTMENT_OTHER): Payer: Self-pay

## 2020-07-31 ENCOUNTER — Other Ambulatory Visit: Payer: BC Managed Care – PPO

## 2020-07-31 ENCOUNTER — Ambulatory Visit: Payer: BC Managed Care – PPO | Admitting: Internal Medicine

## 2020-08-20 ENCOUNTER — Other Ambulatory Visit: Payer: Self-pay | Admitting: Family Medicine

## 2020-08-20 DIAGNOSIS — Z1231 Encounter for screening mammogram for malignant neoplasm of breast: Secondary | ICD-10-CM

## 2020-09-18 ENCOUNTER — Ambulatory Visit: Payer: BC Managed Care – PPO | Admitting: Podiatry

## 2020-10-12 ENCOUNTER — Other Ambulatory Visit: Payer: Self-pay

## 2020-10-12 ENCOUNTER — Ambulatory Visit
Admission: RE | Admit: 2020-10-12 | Discharge: 2020-10-12 | Disposition: A | Discharge status: Home / Self Care | Payer: BC Managed Care – PPO | Source: Ambulatory Visit | Attending: Family Medicine | Admitting: Family Medicine

## 2020-10-12 DIAGNOSIS — Z1231 Encounter for screening mammogram for malignant neoplasm of breast: Secondary | ICD-10-CM

## 2021-01-07 ENCOUNTER — Other Ambulatory Visit: Payer: Self-pay | Admitting: Family Medicine

## 2021-01-07 DIAGNOSIS — E2839 Other primary ovarian failure: Secondary | ICD-10-CM

## 2021-01-11 ENCOUNTER — Ambulatory Visit
Admission: RE | Admit: 2021-01-11 | Discharge: 2021-01-11 | Disposition: A | Discharge status: Home / Self Care | Payer: BC Managed Care – PPO | Source: Ambulatory Visit | Attending: Family Medicine | Admitting: Family Medicine

## 2021-01-11 ENCOUNTER — Other Ambulatory Visit: Payer: Self-pay

## 2021-01-11 DIAGNOSIS — E2839 Other primary ovarian failure: Secondary | ICD-10-CM

## 2021-01-15 ENCOUNTER — Inpatient Hospital Stay: Payer: Medicare PPO | Attending: Internal Medicine | Admitting: Internal Medicine

## 2021-01-15 ENCOUNTER — Other Ambulatory Visit: Payer: Self-pay

## 2021-01-15 ENCOUNTER — Inpatient Hospital Stay: Payer: Medicare PPO

## 2021-01-15 VITALS — BP 143/89 | HR 71 | Temp 97.2°F | Resp 18 | Wt 197.3 lb

## 2021-01-15 DIAGNOSIS — D508 Other iron deficiency anemias: Secondary | ICD-10-CM | POA: Diagnosis not present

## 2021-01-15 DIAGNOSIS — M069 Rheumatoid arthritis, unspecified: Secondary | ICD-10-CM | POA: Diagnosis not present

## 2021-01-15 DIAGNOSIS — I10 Essential (primary) hypertension: Secondary | ICD-10-CM | POA: Diagnosis not present

## 2021-01-15 DIAGNOSIS — D649 Anemia, unspecified: Secondary | ICD-10-CM | POA: Diagnosis not present

## 2021-01-15 DIAGNOSIS — D631 Anemia in chronic kidney disease: Secondary | ICD-10-CM

## 2021-01-15 DIAGNOSIS — I4891 Unspecified atrial fibrillation: Secondary | ICD-10-CM | POA: Insufficient documentation

## 2021-01-15 LAB — IRON AND TIBC
Iron: 80 ug/dL (ref 41–142)
Saturation Ratios: 34 % (ref 21–57)
TIBC: 234 ug/dL — ABNORMAL LOW (ref 236–444)
UIBC: 154 ug/dL (ref 120–384)

## 2021-01-15 LAB — CBC WITH DIFFERENTIAL (CANCER CENTER ONLY)
Abs Immature Granulocytes: 0.01 10*3/uL (ref 0.00–0.07)
Basophils Absolute: 0.1 10*3/uL (ref 0.0–0.1)
Basophils Relative: 1 %
Eosinophils Absolute: 0.1 10*3/uL (ref 0.0–0.5)
Eosinophils Relative: 3 %
HCT: 32.2 % — ABNORMAL LOW (ref 36.0–46.0)
Hemoglobin: 10.3 g/dL — ABNORMAL LOW (ref 12.0–15.0)
Immature Granulocytes: 0 %
Lymphocytes Relative: 29 %
Lymphs Abs: 1.3 10*3/uL (ref 0.7–4.0)
MCH: 29.1 pg (ref 26.0–34.0)
MCHC: 32 g/dL (ref 30.0–36.0)
MCV: 91 fL (ref 80.0–100.0)
Monocytes Absolute: 0.3 10*3/uL (ref 0.1–1.0)
Monocytes Relative: 7 %
Neutro Abs: 2.6 10*3/uL (ref 1.7–7.7)
Neutrophils Relative %: 60 %
Platelet Count: 309 10*3/uL (ref 150–400)
RBC: 3.54 MIL/uL — ABNORMAL LOW (ref 3.87–5.11)
RDW: 14.6 % (ref 11.5–15.5)
WBC Count: 4.4 10*3/uL (ref 4.0–10.5)
nRBC: 0 % (ref 0.0–0.2)

## 2021-01-15 LAB — FERRITIN: Ferritin: 336 ng/mL — ABNORMAL HIGH (ref 11–307)

## 2021-01-15 NOTE — Progress Notes (Signed)
[facility] [phone]   [phone]  OFFICE PROGRESS NOTE  Maurice Small, MD [address] [place] [address]  DIAGNOSIS: Mild normocytic anemia likely anemia of chronic disease.  PRIOR THERAPY: None  CURRENT THERAPY: Over-the-counter multivitamins 1 tablet daily.  INTERVAL HISTORY: Marissa Torres 65 y.o. female returns to the clinic today for 16-month follow-up visit.  The patient is feeling fine today with no concerning complaints except for the mild fatigue.  She was recently diagnosed with atrial fibrillation and currently on flecainide.  She is also on methotrexate for her rheumatoid arthritis.  The patient denied having any current chest pain, shortness of breath, cough or hemoptysis.  She denied having any fever or chills.  She has no nausea, vomiting, diarrhea or constipation.  She is here today for evaluation and repeat CBC, iron study and ferritin.  MEDICAL HISTORY: Past Medical History:  Diagnosis Date   Hypertension     ALLERGIES:  is allergic to [place] [brimonidine tartrate-timolol].  MEDICATIONS:  Current Outpatient Medications  Medication Sig Dispense Refill   acetaminophen (TYLENOL) 650 MG CR tablet Take 1,300 mg by mouth daily.     atenolol (TENORMIN) 50 MG tablet Take 50 mg by mouth daily.     brimonidine (ALPHAGAN) 0.2 % ophthalmic solution      brinzolamide (AZOPT) 1 % ophthalmic suspension Place 1 drop into both eyes 3 (three) times daily.      COVID-19 mRNA vaccine, Pfizer, 30 MCG/0.3ML injection Inject into the muscle. 0.3 mL 0   folic acid (FOLVITE) 1 MG tablet Take 2-3 mg by mouth See admin instructions. Take 3 mg by mouth daily on Thursday, Friday and Saturday. Take 2 mg by mouth daily on all other days.     hydrochlorothiazide (HYDRODIURIL) 12.5 MG tablet Take 12.5 mg by mouth every morning.     meloxicam (MOBIC) 15 MG tablet Take 15 mg by mouth daily.     methotrexate (RHEUMATREX) 2.5 MG  tablet Take 17.5 mg by mouth every Friday.   0   timolol (BETIMOL) 0.25 % ophthalmic solution Place 1 drop into both eyes 2 (two) times daily.     timolol (TIMOPTIC) 0.5 % ophthalmic solution      traMADol (ULTRAM) 50 MG tablet Take by mouth.     Travoprost, BAK Free, (TRAVATAN) 0.004 % SOLN ophthalmic solution      triamcinolone cream (KENALOG) 0.1 % Apply 1 application topically 2 (two) times daily as needed (for ecsema).   0   No current facility-administered medications for this visit.    SURGICAL HISTORY:  Past Surgical History:  Procedure Laterality Date   ABDOMINAL HYSTERECTOMY     CESAREAN SECTION     REDUCTION MAMMAPLASTY Bilateral 2003   SVT ABLATION N/A 05/11/2017   Procedure: SVT ABLATION;  Surgeon: Marinus Maw, MD;  Location: [facility] INVASIVE [facility];  Service: Cardiovascular;  Laterality: N/A;    REVIEW OF SYSTEMS:  A comprehensive review of systems was negative except for: Constitutional: positive for fatigue   PHYSICAL EXAMINATION: General appearance: alert, cooperative, fatigued, and no distress Head: Normocephalic, without obvious abnormality, atraumatic Neck: no adenopathy, no JVD, supple, symmetrical, trachea midline, and thyroid not enlarged, symmetric, no tenderness/mass/nodules Lymph nodes: Cervical, supraclavicular, and axillary nodes normal. Resp: clear to auscultation bilaterally Back: symmetric, no curvature. ROM normal. No CVA tenderness. Cardio: regular rate and rhythm, S1, S2 normal, no murmur, click, rub or gallop GI: soft, non-tender; bowel sounds  normal; no masses,  no organomegaly Extremities: extremities normal, atraumatic, no cyanosis or edema  ECOG PERFORMANCE STATUS: 1 - Symptomatic but completely ambulatory  Blood pressure (!) 143/89, pulse 71, temperature (!) 97.2 F (36.2 C), temperature source Tympanic, resp. rate 18, weight 197 lb 5 oz (89.5 kg), SpO2 100 %.  LABORATORY DATA: Lab Results  Component Value Date   WBC 4.0 07/19/2020   HGB  10.8 (L) 07/19/2020   HCT 33.2 (L) 07/19/2020   MCV 90.2 07/19/2020   PLT 238 07/19/2020      Chemistry      Component Value Date/Time   NA 136 01/12/2020 1225   NA 143 05/04/2017 0118   K 4.0 01/12/2020 1225   CL 103 01/12/2020 1225   CO2 27 01/12/2020 1225   BUN 19 01/12/2020 1225   BUN 21 05/04/2017 0118   CREATININE 1.21 (H) 01/12/2020 1225      Component Value Date/Time   CALCIUM 9.8 01/12/2020 1225   ALKPHOS 76 01/12/2020 1225   AST 21 01/12/2020 1225   ALT 14 01/12/2020 1225   BILITOT 0.4 01/12/2020 1225       RADIOGRAPHIC STUDIES: DG MOBILE BONE DENSITY  Result Date: 01/12/2021 CLINICAL DATA:  Postmenopausal. EXAM: DUAL X-RAY ABSORPTIOMETRY (DXA) FOR BONE MINERAL DENSITY TECHNIQUE: Bone mineral density measurements are performed of the spine, hip, and forearm, as appropriate, per International Society of Clinical Densitometry recommendations. The pertinent regions of interest are reported below. Non-contributory values are not reported. Images are obtained for bone mineral density measurement and are not obtained for diagnostic purposes. FINDINGS: AP LUMBAR SPINE L1-4 Bone Mineral Density (BMD):  1.123 g/cm2 Young Adult T-Score:  -0.2 Z-Score:  1.7 LEFT FEMUR NECK Bone Mineral Density (BMD):  0.916 g/cm2 Young Adult T-Score: -0.2 Z-Score:  1.0 Unit: This study was performed at [facility] on the [employer] (S/N [phone]), software version 13.4.2. Scan quality: The scan quality is good. Exclusions: None ASSESSMENT: Patient's diagnostic category is NORMAL by WHO Criteria. FRACTURE RISK: NOT INCREASED FRAX: World Health Organization FRAX assessment of absolute fracture risk is not calculated for this patient because the patient has all T-scores at or above -1.0. COMPARISON: None. RECOMMENDATIONS 1. All patients should optimize calcium and vitamin D intake. 2. Consider FDA-approved medical therapies in postmenopausal women and men aged 51 years and older, based on the  following: - A hip or vertebral (clinical or morphometric) fracture - T-score less than or equal to -2.5 at the femoral neck or spine after appropriate evaluation to exclude secondary causes - Low bone mass (T-score between -1.0 and -2.5 at the femoral neck or spine) and a 10-year probability of a hip fracture greater than or equal to 3% or a 10-year probability of a major osteoporosis-related fracture greater than or equal to 20% based on the [place]-adapted WHO algorithm - Clinician judgment and/or patient preferences may indicate treatment for people with 10-year fracture probabilities above or below these levels 3. Patients with diagnosis of osteoporosis or at high risk for fracture should have regular bone mineral density tests. For patients eligible for Medicare, routine testing is allowed once every 2 years. The testing frequency can be increased to one year for patients who have rapidly progressing disease, those who are receiving or discontinuing medical therapy to restore bone mass, or have additional risk factors. Electronically Signed   By: Gerome Sam III M.D.   On: 01/12/2021 05:13    ASSESSMENT AND PLAN: This is a very pleasant 65 years old  African-American female with mild normocytic anemia likely anemia of chronic disease secondary to renal insufficiency. The patient is currently on multivitamins and tolerating it fairly well. The patient had repeat CBC today that showed persistent mild anemia with hemoglobin of 10.3.  Iron study and ferritin are still pending. I recommended for her to continue her current treatment with the oral multivitamins.  I would wait for the iron study to see if the patient will need any iron infusion. I will see her back for follow-up visit in 6 months for evaluation with repeat CBC, iron study and ferritin. She was advised to call immediately if she has any other concerning symptoms in the interval. The patient voices understanding of current disease status and  treatment options and is in agreement with the current care plan.  All questions were answered. The patient knows to call the clinic with any problems, questions or concerns. We can certainly see the patient much sooner if necessary.  The total time spent in the appointment was 20 minutes.  Disclaimer: This note was dictated with voice recognition software. Similar sounding words can inadvertently be transcribed and may not be corrected upon review.

## 2021-01-16 ENCOUNTER — Telehealth: Payer: Self-pay | Admitting: Internal Medicine

## 2021-01-16 NOTE — Telephone Encounter (Signed)
Left message with follow-up appointment per 10/4 los. 

## 2021-02-18 ENCOUNTER — Ambulatory Visit: Payer: BC Managed Care – PPO

## 2021-03-11 ENCOUNTER — Ambulatory Visit: Payer: BC Managed Care – PPO | Attending: Internal Medicine

## 2021-03-11 ENCOUNTER — Other Ambulatory Visit (HOSPITAL_BASED_OUTPATIENT_CLINIC_OR_DEPARTMENT_OTHER): Payer: Self-pay

## 2021-03-11 DIAGNOSIS — Z23 Encounter for immunization: Secondary | ICD-10-CM

## 2021-03-11 MED ORDER — PFIZER COVID-19 VAC BIVALENT 30 MCG/0.3ML IM SUSP
INTRAMUSCULAR | 0 refills | Status: AC
  Filled 2021-03-11: qty 0.3, 1d supply, fill #0

## 2021-03-11 NOTE — Progress Notes (Signed)
   Covid-19 Vaccination Clinic  Name:  SINCLAIRE ARTIGA    MRN: [id number] DOB: 1955-11-11  03/11/2021  Ms. Tauzin was observed post Covid-19 immunization for 15 minutes without incident. She was provided with Vaccine Information Sheet and instruction to access the V-Safe system.   Ms. Billard was instructed to call 911 with any severe reactions post vaccine: Difficulty breathing  Swelling of face and throat  A fast heartbeat  A bad rash all over body  Dizziness and weakness   Immunizations Administered     Name Date Dose VIS Date Route   Pfizer Covid-19 Vaccine Bivalent Booster 03/11/2021  1:17 PM 0.3 mL 12/12/2020 Intramuscular   Manufacturer: [employer], [employer]   Lot: [id number]   NDC: [phone]

## 2021-04-30 DIAGNOSIS — M0579 Rheumatoid arthritis with rheumatoid factor of multiple sites without organ or systems involvement: Secondary | ICD-10-CM | POA: Diagnosis not present

## 2021-05-15 DIAGNOSIS — H401131 Primary open-angle glaucoma, bilateral, mild stage: Secondary | ICD-10-CM | POA: Diagnosis not present

## 2021-07-08 DIAGNOSIS — I10 Essential (primary) hypertension: Secondary | ICD-10-CM | POA: Diagnosis not present

## 2021-07-08 DIAGNOSIS — I4891 Unspecified atrial fibrillation: Secondary | ICD-10-CM | POA: Diagnosis not present

## 2021-07-08 DIAGNOSIS — M069 Rheumatoid arthritis, unspecified: Secondary | ICD-10-CM | POA: Diagnosis not present

## 2021-07-08 DIAGNOSIS — Z8616 Personal history of COVID-19: Secondary | ICD-10-CM | POA: Diagnosis not present

## 2021-07-08 DIAGNOSIS — D6869 Other thrombophilia: Secondary | ICD-10-CM | POA: Diagnosis not present

## 2021-07-08 DIAGNOSIS — D649 Anemia, unspecified: Secondary | ICD-10-CM | POA: Diagnosis not present

## 2021-07-10 DIAGNOSIS — K5190 Ulcerative colitis, unspecified, without complications: Secondary | ICD-10-CM | POA: Diagnosis not present

## 2021-07-10 DIAGNOSIS — L409 Psoriasis, unspecified: Secondary | ICD-10-CM | POA: Diagnosis not present

## 2021-07-10 DIAGNOSIS — E669 Obesity, unspecified: Secondary | ICD-10-CM | POA: Diagnosis not present

## 2021-07-10 DIAGNOSIS — M0579 Rheumatoid arthritis with rheumatoid factor of multiple sites without organ or systems involvement: Secondary | ICD-10-CM | POA: Diagnosis not present

## 2021-07-10 DIAGNOSIS — Z79899 Other long term (current) drug therapy: Secondary | ICD-10-CM | POA: Diagnosis not present

## 2021-07-10 DIAGNOSIS — Z6834 Body mass index (BMI) 34.0-34.9, adult: Secondary | ICD-10-CM | POA: Diagnosis not present

## 2021-07-10 DIAGNOSIS — M170 Bilateral primary osteoarthritis of knee: Secondary | ICD-10-CM | POA: Diagnosis not present

## 2021-07-12 ENCOUNTER — Emergency Department (HOSPITAL_BASED_OUTPATIENT_CLINIC_OR_DEPARTMENT_OTHER)
Admission: EM | Admit: 2021-07-12 | Discharge: 2021-07-12 | Disposition: A | Discharge status: Home / Self Care | Payer: Medicare PPO | Attending: Emergency Medicine | Admitting: Emergency Medicine

## 2021-07-12 ENCOUNTER — Encounter (HOSPITAL_BASED_OUTPATIENT_CLINIC_OR_DEPARTMENT_OTHER): Payer: Self-pay

## 2021-07-12 ENCOUNTER — Other Ambulatory Visit: Payer: Self-pay

## 2021-07-12 DIAGNOSIS — G500 Trigeminal neuralgia: Secondary | ICD-10-CM | POA: Diagnosis not present

## 2021-07-12 DIAGNOSIS — R519 Headache, unspecified: Secondary | ICD-10-CM | POA: Diagnosis present

## 2021-07-12 HISTORY — DX: Unspecified atrial fibrillation: I48.91

## 2021-07-12 HISTORY — DX: Unspecified osteoarthritis, unspecified site: M19.90

## 2021-07-12 HISTORY — DX: Anemia, unspecified: D64.9

## 2021-07-12 MED ORDER — GABAPENTIN 300 MG PO CAPS: 300.0000 mg | ORAL_CAPSULE | Freq: Three times a day (TID) | ORAL | 0 refills | Status: DC | PRN

## 2021-07-12 MED ORDER — OXCARBAZEPINE 300 MG PO TABS: 300.0000 mg | ORAL_TABLET | Freq: Two times a day (BID) | ORAL | 0 refills | Status: DC

## 2021-07-12 MED ORDER — METHYLPREDNISOLONE 4 MG PO TBPK: ORAL_TABLET | ORAL | 0 refills | Status: AC

## 2021-07-12 NOTE — ED Provider Notes (Signed)
?[facility] EMERGENCY DEPT ?Provider Note ? ? ?CSN: [id number] ?Arrival date & time: 07/12/21  [id number] ? ?  ? ?History ? ?CC: Facial pain ? ? ?Marissa Torres is a 66 y.o. female presented emergency department with pain on the right side of her face.  The patient reports she had abrupt onset of sharp, stabbing pain involving the right side of yesterday evening, and overnight.  These episodes would last a few seconds and go away.  She reports the pain involved to her right temple, near her right ear, down into her right lower jaw.  She took Tylenol with no relief.  She has never had the symptoms before.  She denies any persistent headache, blurred vision, numbness or weakness of the face or extremities, or any recent facial trauma.  She denies any history of malignancy.  She denies daily headaches. ? ?No ear drainage, change in hearing, ringing in ears ? ?HPI ? ?  ? ?Home Medications ?Prior to Admission medications   ?Medication Sig Start Date End Date Taking? Authorizing Provider  ?gabapentin (NEURONTIN) 300 MG capsule Take 1 capsule (300 mg total) by mouth 3 (three) times daily as needed for up to 30 doses. 07/12/21  Yes Terald Sleeper, MD  ?methylPREDNISolone (MEDROL DOSEPAK) 4 MG TBPK tablet Take as directed on package 07/12/21  Yes Neal Trulson, Kermit Balo, MD  ?Oxcarbazepine (TRILEPTAL) 300 MG tablet Take 1 tablet (300 mg total) by mouth 2 (two) times daily. 07/12/21 08/11/21 Yes Ayelen Sciortino, Kermit Balo, MD  ?acetaminophen (TYLENOL) 650 MG CR tablet Take 1,300 mg by mouth daily.    [provider]  ?atenolol (TENORMIN) 50 MG tablet Take 50 mg by mouth daily. 11/12/19   [provider]  ?brimonidine (ALPHAGAN) 0.2 % ophthalmic solution  04/22/18   [provider]  ?brinzolamide (AZOPT) 1 % ophthalmic suspension Place 1 drop into both eyes 3 (three) times daily.     [provider]  ?COVID-19 mRNA bivalent vaccine, Pfizer, (PFIZER COVID-19 VAC BIVALENT) injection Inject into the  muscle. 03/11/21   Judyann Munson, MD  ?COVID-19 mRNA vaccine, Pfizer, 30 MCG/0.3ML injection Inject into the muscle. 07/23/20   Judyann Munson, MD  ?folic acid (FOLVITE) 1 MG tablet Take 2-3 mg by mouth See admin instructions. Take 3 mg by mouth daily on Thursday, Friday and Saturday. Take 2 mg by mouth daily on all other days.    [provider]  ?hydrochlorothiazide (HYDRODIURIL) 12.5 MG tablet Take 12.5 mg by mouth every morning. 11/12/19   [provider]  ?meloxicam (MOBIC) 15 MG tablet Take 15 mg by mouth daily.    [provider]  ?methotrexate (RHEUMATREX) 2.5 MG tablet Take 17.5 mg by mouth every Friday.  12/30/16   [provider]  ?timolol (BETIMOL) 0.25 % ophthalmic solution Place 1 drop into both eyes 2 (two) times daily.    [provider]  ?timolol (TIMOPTIC) 0.5 % ophthalmic solution  04/30/18   [provider]  ?traMADol (ULTRAM) 50 MG tablet Take by mouth.    [provider]  ?Travoprost, BAK Free, (TRAVATAN) 0.004 % SOLN ophthalmic solution  04/22/18   [provider]  ?triamcinolone cream (KENALOG) 0.1 % Apply 1 application topically 2 (two) times daily as needed (for ecsema).  04/23/17   [provider]  ?   ? ?Allergies    ?Combigan [brimonidine tartrate-timolol]   ? ?Review of Systems   ?Review of Systems ? ?Physical Exam ?Updated Vital Signs ?BP (!) 186/101 (BP Location:  Right Arm)   Pulse 74   Temp 98.2 ?F (36.8 ?C)   Resp 16   Ht 5\' 4"  (1.626 m)   Wt 93.4 kg   SpO2 100%   BMI 35.36 kg/m?  ?Physical Exam ?Constitutional:   ?   General: She is not in acute distress. ?HENT:  ?   Head: Normocephalic and atraumatic.  ?Eyes:  ?   General: No visual field deficit. ?   Conjunctiva/sclera: Conjunctivae normal.  ?   Pupils: Pupils are equal, round, and reactive to light.  ?Cardiovascular:  ?   Rate and Rhythm: Normal rate and regular rhythm.  ?Pulmonary:  ?   Effort: Pulmonary effort is normal. No respiratory  distress.  ?Musculoskeletal:  ?   Comments: No tenderness of temporal artery ?Negative chvostek sign  ?Skin: ?   General: Skin is warm and dry.  ?Neurological:  ?   General: No focal deficit present.  ?   Mental Status: She is alert. Mental status is at baseline.  ?   GCS: GCS eye subscore is 4. GCS verbal subscore is 5. GCS motor subscore is 6.  ?   Cranial Nerves: No cranial nerve deficit, dysarthria or facial asymmetry.  ?   Sensory: No sensory deficit.  ?Psychiatric:     ?   Mood and Affect: Mood normal.     ?   Behavior: Behavior normal.  ? ? ?ED Results / Procedures / Treatments   ?Labs ?(all labs ordered are listed, but only abnormal results are displayed) ?Labs Reviewed - No data to display ? ?EKG ?None ? ?Radiology ?No results found. ? ?Procedures ?Procedures  ? ? ?Medications Ordered in ED ?Medications - No data to display ? ?ED Course/ Medical Decision Making/ A&P ?  ?                        ?Medical Decision Making ?Risk ?Prescription drug management. ? ? ?Right sided lancing facial pain since yesterday.  No neurological deficits noted on my exam. ?Distribution and pattern of pain is most consistent with trigeminal neuralgia ?No temporal tenderness or vision deficits to suggest GCA ?No persistent or daily headaches to suggest intracranial mass ?I don't see a zoster rash on exam ?No evidence of acute inner or external ear infection  ? ?She is asymptomatic here, not requiring pain control in the ED. ? ?I do not see an emergent indication for neuro imaging at this time, or blood tests.   I have a low suspicion for CVA or infection.  We will start on a short steroid burst for the next few days with a medrol dose pack (she has tolerated steroids well in the past per her report, and is not diabetic).  She can use PRN gabapentin and tylenol if the pain returns.  Oxcarbamazepine prescription provided for 30 days, but she was advised to start it only if her pain returns with higher frequency, and that this should  be monitored by a neurologist.  I placed a referral to neurology.  Side effects of these medications were described in her discharge summary.  She verbalized understanding and agreement with the plan. ? ?Return precautions given for any new or additional neurological symptoms, which may be indicative of CVA or other more concerning condition. ? ? ? ? ? ? ? ?Final Clinical Impression(s) / ED Diagnoses ?Final diagnoses:  ?Trigeminal neuralgia of right side of face  ? ? ?Rx / DC Orders ?ED Discharge Orders   ? ?  Ordered  ?  methylPREDNISolone (MEDROL DOSEPAK) 4 MG TBPK tablet       ? 07/12/21 0816  ?  gabapentin (NEURONTIN) 300 MG capsule  3 times daily PRN       ? 07/12/21 0816  ?  Oxcarbazepine (TRILEPTAL) 300 MG tablet  2 times daily       ? 07/12/21 0816  ?  Ambulatory referral to Neurology       ?Comments: An appointment is requested in approximately: 2 weeks ?Suspected new diagnosis of trigeminal neuralgia  ? 07/12/21 0816  ? ?  ?  ? ?  ? ? ?  ?Terald Sleeper, MD ?07/12/21 [phone] ? ?

## 2021-07-12 NOTE — ED Triage Notes (Signed)
Patient c/o right sided temple pain that radiates through most of right sided face/ears. States comes and goes since yesterday at 1500. ?

## 2021-07-12 NOTE — Discharge Instructions (Addendum)
?  We discussed the likelihood that you may be experiencing a condition called trigeminal neuralgia.  This is an issue with irritation and pain from one of the facial nerves on the right side of your face.  This can occur at random, but I included some information for you to read over, as there are some possible triggers for this.  Generally this is managed by a neurologist, you can call to schedule an appointment with neurology if you continue having pain. I placed a referral in the system to [facility]. ? ?If you do experience more episodes of pain, you should begin by taking 300 mg gabapentin and 650 mg of tylenol together.  You can take gabapentin up to 3 times a day as needed.  Please be aware that some people do get very drowsy after taking gabapentin, so you should not be driving or operating heavy machinery after taking this medicine. ? ?If you have more significant pain or the gabapentin is not controlling your pain, you can fill a prescription for ox- carbamazepine.  This is a medication is taken twice a day to prevent episodes from occurring.  This medicine should be continued and managed by a neurologist, who can adjust your dosing as needed.  Side effects of this medication may include: nausea, vomiting, diarrhea, hyponatremia, rash, pruritus, drowsiness, dizziness, blurred or double vision, lethargy, and headache.  More serious side effects include severe rashes and whole body skin reactions - stop your medication immediately if this occurs and contact your doctor. ? ?* ? ?You should return to the ER if you experience any new neurological symptoms, which would include facial droop, blurred or loss of vision, difficulty with balance, numbness or weakness in your arms or legs, difficulty with speech, or severely worsening pain. ?

## 2021-07-15 ENCOUNTER — Other Ambulatory Visit: Payer: Self-pay

## 2021-07-15 DIAGNOSIS — D508 Other iron deficiency anemias: Secondary | ICD-10-CM

## 2021-07-16 ENCOUNTER — Inpatient Hospital Stay: Payer: Medicare PPO | Attending: Internal Medicine | Admitting: Internal Medicine

## 2021-07-16 ENCOUNTER — Encounter: Payer: Self-pay | Admitting: Internal Medicine

## 2021-07-16 ENCOUNTER — Inpatient Hospital Stay: Payer: Medicare PPO

## 2021-07-16 ENCOUNTER — Other Ambulatory Visit: Payer: Self-pay

## 2021-07-16 VITALS — BP 160/99 | HR 59 | Temp 97.6°F | Resp 17 | Wt 200.0 lb

## 2021-07-16 DIAGNOSIS — Z79899 Other long term (current) drug therapy: Secondary | ICD-10-CM | POA: Insufficient documentation

## 2021-07-16 DIAGNOSIS — D649 Anemia, unspecified: Secondary | ICD-10-CM | POA: Insufficient documentation

## 2021-07-16 DIAGNOSIS — D631 Anemia in chronic kidney disease: Secondary | ICD-10-CM | POA: Diagnosis not present

## 2021-07-16 DIAGNOSIS — R5383 Other fatigue: Secondary | ICD-10-CM | POA: Insufficient documentation

## 2021-07-16 DIAGNOSIS — D508 Other iron deficiency anemias: Secondary | ICD-10-CM

## 2021-07-16 DIAGNOSIS — N189 Chronic kidney disease, unspecified: Secondary | ICD-10-CM | POA: Diagnosis not present

## 2021-07-16 LAB — CBC WITH DIFFERENTIAL (CANCER CENTER ONLY)
Abs Immature Granulocytes: 0.02 10*3/uL (ref 0.00–0.07)
Basophils Absolute: 0.1 10*3/uL (ref 0.0–0.1)
Basophils Relative: 1 %
Eosinophils Absolute: 0.1 10*3/uL (ref 0.0–0.5)
Eosinophils Relative: 1 %
HCT: 35.4 % — ABNORMAL LOW (ref 36.0–46.0)
Hemoglobin: 11.2 g/dL — ABNORMAL LOW (ref 12.0–15.0)
Immature Granulocytes: 0 %
Lymphocytes Relative: 26 %
Lymphs Abs: 1.6 10*3/uL (ref 0.7–4.0)
MCH: 29 pg (ref 26.0–34.0)
MCHC: 31.6 g/dL (ref 30.0–36.0)
MCV: 91.7 fL (ref 80.0–100.0)
Monocytes Absolute: 0.6 10*3/uL (ref 0.1–1.0)
Monocytes Relative: 9 %
Neutro Abs: 3.8 10*3/uL (ref 1.7–7.7)
Neutrophils Relative %: 63 %
Platelet Count: 307 10*3/uL (ref 150–400)
RBC: 3.86 MIL/uL — ABNORMAL LOW (ref 3.87–5.11)
RDW: 14.8 % (ref 11.5–15.5)
WBC Count: 6.1 10*3/uL (ref 4.0–10.5)
nRBC: 0 % (ref 0.0–0.2)

## 2021-07-16 LAB — IRON AND IRON BINDING CAPACITY (CC-WL,HP ONLY)
Iron: 74 ug/dL (ref 28–170)
Saturation Ratios: 26 % (ref 10.4–31.8)
TIBC: 287 ug/dL (ref 250–450)
UIBC: 213 ug/dL (ref 148–442)

## 2021-07-16 LAB — FERRITIN: Ferritin: 207 ng/mL (ref 11–307)

## 2021-07-16 NOTE — Progress Notes (Signed)
?    [place] ?[phone]   [phone] ? ?OFFICE PROGRESS NOTE ? ?Kelton Pillar, MD ?[address] ?[place] [address] ? ?DIAGNOSIS: Mild normocytic anemia likely anemia of chronic disease. ? ?PRIOR THERAPY: None ? ?CURRENT THERAPY: Over-the-counter multivitamins 1 tablet daily. ? ?INTERVAL HISTORY: ?Marissa Torres 66 y.o. female returns to the clinic today for follow-up visit.  The patient is feeling fine today with no concerning complaints except for the mild fatigue.  She denied having any current chest pain, shortness of breath, cough or hemoptysis.  She has no nausea, vomiting, diarrhea or constipation.  She denied having any headache or visual changes.  She has no recent weight loss or night sweats.  She is here today for evaluation and repeat CBC, iron study and ferritin.  She continues to tolerate her oral multivitamins fairly well. ?. ? ?MEDICAL HISTORY: ?Past Medical History:  ?Diagnosis Date  ? A-fib ([place])   ? Anemia   ? Arthritis   ? Hypertension   ? ? ?ALLERGIES:  is allergic to [place] [brimonidine tartrate-timolol]. ? ?MEDICATIONS:  ?Current Outpatient Medications  ?Medication Sig Dispense Refill  ? acetaminophen (TYLENOL) 650 MG CR tablet Take 1,300 mg by mouth daily.    ? atenolol (TENORMIN) 50 MG tablet Take 50 mg by mouth daily.    ? brimonidine (ALPHAGAN) 0.2 % ophthalmic solution     ? brinzolamide (AZOPT) 1 % ophthalmic suspension Place 1 drop into both eyes 3 (three) times daily.     ? COVID-19 mRNA bivalent vaccine, Pfizer, (PFIZER COVID-19 VAC BIVALENT) injection Inject into the muscle. 0.3 mL 0  ? COVID-19 mRNA vaccine, Pfizer, 30 MCG/0.3ML injection Inject into the muscle. 0.3 mL 0  ? folic acid (FOLVITE) 1 MG tablet Take 2-3 mg by mouth See admin instructions. Take 3 mg by mouth daily on Thursday, Friday and Saturday. Take 2 mg by mouth daily on all other days.    ? gabapentin (NEURONTIN) 300 MG capsule Take 1 capsule (300 mg  total) by mouth 3 (three) times daily as needed for up to 30 doses. 30 capsule 0  ? hydrochlorothiazide (HYDRODIURIL) 12.5 MG tablet Take 12.5 mg by mouth every morning.    ? meloxicam (MOBIC) 15 MG tablet Take 15 mg by mouth daily.    ? methotrexate (RHEUMATREX) 2.5 MG tablet Take 17.5 mg by mouth every Friday.   0  ? methylPREDNISolone (MEDROL DOSEPAK) 4 MG TBPK tablet Take as directed on package 21 tablet 0  ? Oxcarbazepine (TRILEPTAL) 300 MG tablet Take 1 tablet (300 mg total) by mouth 2 (two) times daily. 60 tablet 0  ? timolol (BETIMOL) 0.25 % ophthalmic solution Place 1 drop into both eyes 2 (two) times daily.    ? timolol (TIMOPTIC) 0.5 % ophthalmic solution     ? traMADol (ULTRAM) 50 MG tablet Take by mouth.    ? Travoprost, BAK Free, (TRAVATAN) 0.004 % SOLN ophthalmic solution     ? triamcinolone cream (KENALOG) 0.1 % Apply 1 application topically 2 (two) times daily as needed (for ecsema).   0  ? ?No current facility-administered medications for this visit.  ? ? ?SURGICAL HISTORY:  ?Past Surgical History:  ?Procedure Laterality Date  ? ABDOMINAL HYSTERECTOMY    ? CESAREAN SECTION    ? REDUCTION MAMMAPLASTY Bilateral 2003  ? SVT ABLATION N/A 05/11/2017  ? Procedure: SVT ABLATION;  Surgeon: Evans Lance, MD;  Location: [facility];  Service: Cardiovascular;  Laterality: N/A;  ? ? ?REVIEW OF SYSTEMS:  A comprehensive review of systems was negative except for: Constitutional: positive for fatigue  ? ?PHYSICAL EXAMINATION: General appearance: alert, cooperative, fatigued, and no distress ?Head: Normocephalic, without obvious abnormality, atraumatic ?Neck: no adenopathy, no JVD, supple, symmetrical, trachea midline, and thyroid not enlarged, symmetric, no tenderness/mass/nodules ?Lymph nodes: Cervical, supraclavicular, and axillary nodes normal. ?Resp: clear to auscultation bilaterally ?Back: symmetric, no curvature. ROM normal. No CVA tenderness. ?Cardio: regular rate and rhythm, S1, S2 normal, no  murmur, click, rub or gallop ?GI: soft, non-tender; bowel sounds normal; no masses,  no organomegaly ?Extremities: extremities normal, atraumatic, no cyanosis or edema ? ?ECOG PERFORMANCE STATUS: 1 - Symptomatic but completely ambulatory ? ?Blood pressure (!) 160/99, pulse (!) 59, temperature 97.6 ?F (36.4 ?C), temperature source Tympanic, resp. rate 17, weight 200 lb (90.7 kg), SpO2 100 %. ? ?LABORATORY DATA: ?Lab Results  ?Component Value Date  ? WBC 4.4 01/15/2021  ? HGB 10.3 (L) 01/15/2021  ? HCT 32.2 (L) 01/15/2021  ? MCV 91.0 01/15/2021  ? PLT 309 01/15/2021  ? ? ?  Chemistry   ?   ?Component Value Date/Time  ? NA 136 01/12/2020 1225  ? NA 143 05/04/2017 0118  ? K 4.0 01/12/2020 1225  ? CL 103 01/12/2020 1225  ? CO2 27 01/12/2020 1225  ? BUN 19 01/12/2020 1225  ? BUN 21 05/04/2017 0118  ? CREATININE 1.21 (H) 01/12/2020 1225  ?    ?Component Value Date/Time  ? CALCIUM 9.8 01/12/2020 1225  ? ALKPHOS 76 01/12/2020 1225  ? AST 21 01/12/2020 1225  ? ALT 14 01/12/2020 1225  ? BILITOT 0.4 01/12/2020 1225  ?  ? ? ? ?RADIOGRAPHIC STUDIES: ?No results found. ? ?ASSESSMENT AND PLAN: This is a very pleasant 66 years old African-American female with mild normocytic anemia likely anemia of chronic disease secondary to renal insufficiency. ?The patient is currently on multivitamins and tolerating it fairly well. ?The patient is feeling fine today with no concerning complaints except for the mild fatigue. ?Repeat CBC showed persistent mild anemia with hemoglobin of 11.1 hematocrit 35.4%. ?I recommended for the patient to continue her current treatment with over-the-counter multivitamins.  I also recommend for her to follow-up with her primary care physician from now on and I will see her in the future on an as-needed basis if she has significant anemia. ?The patient was advised to call immediately if she has any concerning symptoms in the interval. ?The patient voices understanding of current disease status and treatment  options and is in agreement with the current care plan. ? ?All questions were answered. The patient knows to call the clinic with any problems, questions or concerns. We can certainly see the patient much sooner if necessary. ? ?The total time spent in the appointment was 20 minutes. ? ?Disclaimer: This note was dictated with voice recognition software. Similar sounding words can inadvertently be transcribed and may not be corrected upon review. ? ? ?  ?  ?

## 2021-08-06 DIAGNOSIS — I10 Essential (primary) hypertension: Secondary | ICD-10-CM | POA: Diagnosis not present

## 2021-08-06 DIAGNOSIS — I471 Supraventricular tachycardia: Secondary | ICD-10-CM | POA: Diagnosis not present

## 2021-08-06 DIAGNOSIS — I480 Paroxysmal atrial fibrillation: Secondary | ICD-10-CM | POA: Diagnosis not present

## 2021-09-02 DIAGNOSIS — H401131 Primary open-angle glaucoma, bilateral, mild stage: Secondary | ICD-10-CM | POA: Diagnosis not present

## 2021-09-06 ENCOUNTER — Other Ambulatory Visit (HOSPITAL_BASED_OUTPATIENT_CLINIC_OR_DEPARTMENT_OTHER): Payer: Self-pay

## 2021-09-06 ENCOUNTER — Ambulatory Visit: Payer: Medicare PPO | Attending: Internal Medicine

## 2021-09-06 DIAGNOSIS — Z23 Encounter for immunization: Secondary | ICD-10-CM

## 2021-09-06 MED ORDER — PFIZER COVID-19 VAC BIVALENT 30 MCG/0.3ML IM SUSP
INTRAMUSCULAR | 0 refills | Status: AC
  Filled 2021-09-06: qty 0.3, 1d supply, fill #0

## 2021-09-10 ENCOUNTER — Other Ambulatory Visit (HOSPITAL_BASED_OUTPATIENT_CLINIC_OR_DEPARTMENT_OTHER): Payer: Self-pay

## 2021-09-10 NOTE — Progress Notes (Signed)
   Covid-19 Vaccination Clinic  Name:  Marissa Torres    MRN: [id number] DOB: August 09, 1955  09/10/2021  Marissa Torres was observed post Covid-19 immunization for 15 minutes without incident. She was provided with Vaccine Information Sheet and instruction to access the V-Safe system.   Marissa Torres was instructed to call 911 with any severe reactions post vaccine: Difficulty breathing  Swelling of face and throat  A fast heartbeat  A bad rash all over body  Dizziness and weakness   Immunizations Administered     Name Date Dose VIS Date Route   Pfizer Covid-19 Vaccine Bivalent Booster 09/06/2021  3:00 PM 0.3 mL 12/12/2020 Intramuscular   Manufacturer: [employer], [employer]   Lot: [id number]   NDC: [phone]

## 2021-11-04 DIAGNOSIS — M0579 Rheumatoid arthritis with rheumatoid factor of multiple sites without organ or systems involvement: Secondary | ICD-10-CM | POA: Diagnosis not present

## 2021-11-12 ENCOUNTER — Other Ambulatory Visit: Payer: Self-pay | Admitting: Family Medicine

## 2021-11-12 DIAGNOSIS — Z1231 Encounter for screening mammogram for malignant neoplasm of breast: Secondary | ICD-10-CM

## 2021-11-21 ENCOUNTER — Ambulatory Visit
Admission: RE | Admit: 2021-11-21 | Discharge: 2021-11-21 | Disposition: A | Discharge status: Home / Self Care | Payer: Medicare PPO | Source: Ambulatory Visit | Attending: Family Medicine | Admitting: Family Medicine

## 2021-11-21 DIAGNOSIS — Z1231 Encounter for screening mammogram for malignant neoplasm of breast: Secondary | ICD-10-CM | POA: Diagnosis not present

## 2021-12-17 ENCOUNTER — Other Ambulatory Visit: Payer: Self-pay

## 2021-12-17 ENCOUNTER — Emergency Department (HOSPITAL_BASED_OUTPATIENT_CLINIC_OR_DEPARTMENT_OTHER)
Admission: EM | Admit: 2021-12-17 | Discharge: 2021-12-17 | Disposition: A | Discharge status: Home / Self Care | Payer: Medicare PPO | Attending: Emergency Medicine | Admitting: Emergency Medicine

## 2021-12-17 ENCOUNTER — Emergency Department (HOSPITAL_BASED_OUTPATIENT_CLINIC_OR_DEPARTMENT_OTHER): Payer: Medicare PPO

## 2021-12-17 ENCOUNTER — Encounter (HOSPITAL_BASED_OUTPATIENT_CLINIC_OR_DEPARTMENT_OTHER): Payer: Self-pay | Admitting: Obstetrics and Gynecology

## 2021-12-17 DIAGNOSIS — R1032 Left lower quadrant pain: Secondary | ICD-10-CM

## 2021-12-17 DIAGNOSIS — R110 Nausea: Secondary | ICD-10-CM | POA: Diagnosis not present

## 2021-12-17 DIAGNOSIS — I10 Essential (primary) hypertension: Secondary | ICD-10-CM | POA: Diagnosis not present

## 2021-12-17 DIAGNOSIS — Z7901 Long term (current) use of anticoagulants: Secondary | ICD-10-CM | POA: Insufficient documentation

## 2021-12-17 DIAGNOSIS — I4891 Unspecified atrial fibrillation: Secondary | ICD-10-CM | POA: Diagnosis not present

## 2021-12-17 DIAGNOSIS — R197 Diarrhea, unspecified: Secondary | ICD-10-CM | POA: Diagnosis not present

## 2021-12-17 DIAGNOSIS — R109 Unspecified abdominal pain: Secondary | ICD-10-CM | POA: Diagnosis present

## 2021-12-17 DIAGNOSIS — N281 Cyst of kidney, acquired: Secondary | ICD-10-CM | POA: Diagnosis not present

## 2021-12-17 LAB — COMPREHENSIVE METABOLIC PANEL
ALT: 15 U/L (ref 0–44)
AST: 25 U/L (ref 15–41)
Albumin: 4.7 g/dL (ref 3.5–5.0)
Alkaline Phosphatase: 68 U/L (ref 38–126)
Anion gap: 10 (ref 5–15)
BUN: 14 mg/dL (ref 8–23)
CO2: 26 mmol/L (ref 22–32)
Calcium: 9.6 mg/dL (ref 8.9–10.3)
Chloride: 103 mmol/L (ref 98–111)
Creatinine, Ser: 1.08 mg/dL — ABNORMAL HIGH (ref 0.44–1.00)
GFR, Estimated: 57 mL/min — ABNORMAL LOW (ref >60)
Glucose, Bld: 89 mg/dL (ref 70–99)
Potassium: 4.3 mmol/L (ref 3.5–5.1)
Sodium: 139 mmol/L (ref 135–145)
Total Bilirubin: 0.5 mg/dL (ref 0.3–1.2)
Total Protein: 7.9 g/dL (ref 6.5–8.1)

## 2021-12-17 LAB — CBC
HCT: 35.2 % — ABNORMAL LOW (ref 36.0–46.0)
Hemoglobin: 11.3 g/dL — ABNORMAL LOW (ref 12.0–15.0)
MCH: 29.7 pg (ref 26.0–34.0)
MCHC: 32.1 g/dL (ref 30.0–36.0)
MCV: 92.6 fL (ref 80.0–100.0)
Platelets: 283 10*3/uL (ref 150–400)
RBC: 3.8 MIL/uL — ABNORMAL LOW (ref 3.87–5.11)
RDW: 14.3 % (ref 11.5–15.5)
WBC: 4.4 10*3/uL (ref 4.0–10.5)
nRBC: 0 % (ref 0.0–0.2)

## 2021-12-17 LAB — URINALYSIS, ROUTINE W REFLEX MICROSCOPIC
Bilirubin Urine: NEGATIVE
Glucose, UA: NEGATIVE mg/dL
Hgb urine dipstick: NEGATIVE
Ketones, ur: NEGATIVE mg/dL
Leukocytes,Ua: NEGATIVE
Nitrite: NEGATIVE
Protein, ur: NEGATIVE mg/dL
Specific Gravity, Urine: 1.02 (ref 1.005–1.030)
pH: 6 (ref 5.0–8.0)

## 2021-12-17 LAB — LIPASE, BLOOD: Lipase: 47 U/L (ref 11–51)

## 2021-12-17 MED ORDER — ONDANSETRON HCL 4 MG/2ML IJ SOLN
4.0000 mg | Freq: Once | INTRAMUSCULAR | Status: AC
  Administered 2021-12-17: 4 mg via INTRAVENOUS
  Filled 2021-12-17: qty 2

## 2021-12-17 MED ORDER — LACTATED RINGERS IV BOLUS
1000.0000 mL | Freq: Once | INTRAVENOUS | Status: AC
  Administered 2021-12-17: 1000 mL via INTRAVENOUS

## 2021-12-17 MED ORDER — IOHEXOL 300 MG/ML  SOLN
100.0000 mL | Freq: Once | INTRAMUSCULAR | Status: AC | PRN
  Administered 2021-12-17: 85 mL via INTRAVENOUS

## 2021-12-17 NOTE — ED Provider Notes (Signed)
[facility] EMERGENCY DEPT Provider Note   CSN: [id number] Arrival date & time: 12/17/21  4098     History  Chief Complaint  Patient presents with   Abdominal Pain    Marissa Torres is a 66 y.o. female.  Patient is a 66 year old female with a history of hypertension, atrial fibrillation on Xarelto, anemia and prior C-section who is presenting today with complaint of left-sided abdominal and flank pain that has been present for the last 4 days.  She reports it started while she was in [place] and has been waxing and waning.  Currently the pain is only a 1 but sometimes it will spike up to a 9.  She is having and nausea associated with this and did develop some diarrhea this morning.  She denies any urinary symptoms such as frequency urgency or dysuria but has noticed her urine looking dark.  She has had nausea without vomiting.  She denies any fevers.  She denies history of similar symptoms.  On the trip she ate the same as everybody else and everybody else was feeling well and has low suspicion for foodborne illness.  The history is provided by the patient.  Abdominal Pain      Home Medications Prior to Admission medications   Medication Sig Start Date End Date Taking? Authorizing Provider  acetaminophen (TYLENOL) 650 MG CR tablet Take 1,300 mg by mouth daily.    [provider]  atenolol (TENORMIN) 50 MG tablet Take 50 mg by mouth daily. 11/12/19   [provider]  brimonidine (ALPHAGAN) 0.2 % ophthalmic solution  04/22/18   [provider]  brinzolamide (AZOPT) 1 % ophthalmic suspension Place 1 drop into both eyes 3 (three) times daily.     [provider]  COVID-19 mRNA bivalent vaccine, Pfizer, (PFIZER COVID-19 VAC BIVALENT) injection Inject into the muscle. 03/11/21   Judyann Munson, MD  COVID-19 mRNA bivalent vaccine, Pfizer, (PFIZER COVID-19 VAC BIVALENT) injection Inject into the muscle. 09/06/21   Judyann Munson, MD  COVID-19  mRNA vaccine, Pfizer, 30 MCG/0.3ML injection Inject into the muscle. 07/23/20   Judyann Munson, MD  folic acid (FOLVITE) 1 MG tablet Take 2-3 mg by mouth See admin instructions. Take 3 mg by mouth daily on Thursday, Friday and Saturday. Take 2 mg by mouth daily on all other days.    [provider]  hydrochlorothiazide (HYDRODIURIL) 12.5 MG tablet Take 12.5 mg by mouth every morning. 11/12/19   [provider]  meloxicam (MOBIC) 15 MG tablet Take 15 mg by mouth daily.    [provider]  methotrexate (RHEUMATREX) 2.5 MG tablet Take 17.5 mg by mouth every Friday.  12/30/16   [provider]  methylPREDNISolone (MEDROL DOSEPAK) 4 MG TBPK tablet Take as directed on package 07/12/21   Terald Sleeper, MD  rivaroxaban (XARELTO) 20 MG TABS tablet Take 1 tablet by mouth daily. 08/29/20   [provider]  timolol (BETIMOL) 0.25 % ophthalmic solution Place 1 drop into both eyes 2 (two) times daily.    [provider]  timolol (TIMOPTIC) 0.5 % ophthalmic solution  04/30/18   [provider]  traMADol (ULTRAM) 50 MG tablet Take by mouth.    [provider]  Travoprost, BAK Free, (TRAVATAN) 0.004 % SOLN ophthalmic solution  04/22/18   [provider]  triamcinolone cream (KENALOG) 0.1 % Apply 1 application topically 2 (two) times daily as needed (for ecsema).  04/23/17   [provider]  Allergies    Combigan [brimonidine tartrate-timolol]    Review of Systems   Review of Systems  Gastrointestinal:  Positive for abdominal pain.    Physical Exam Updated Vital Signs BP (!) 142/85 (BP Location: Right Arm)   Pulse (!) 56   Temp 98.2 F (36.8 C)   Resp 20   Ht 5\' 4"  (1.626 m)   Wt 90.7 kg   SpO2 100%   BMI 34.33 kg/m  Physical Exam Vitals and nursing note reviewed.  Constitutional:      General: She is not in acute distress.    Appearance: She is well-developed.  HENT:     Head: Normocephalic and  atraumatic.  Eyes:     Conjunctiva/sclera: Conjunctivae normal.     Pupils: Pupils are equal, round, and reactive to light.  Cardiovascular:     Rate and Rhythm: Normal rate and regular rhythm.     Heart sounds: No murmur heard. Pulmonary:     Effort: Pulmonary effort is normal. No respiratory distress.     Breath sounds: Normal breath sounds. No wheezing or rales.  Abdominal:     General: There is no distension.     Palpations: Abdomen is soft.     Tenderness: There is no abdominal tenderness. There is left CVA tenderness. There is no guarding or rebound.  Musculoskeletal:        General: No tenderness. Normal range of motion.     Cervical back: Normal range of motion and neck supple.  Skin:    General: Skin is warm and dry.     Findings: No erythema or rash.  Neurological:     Mental Status: She is alert and oriented to person, place, and time.  Psychiatric:        Behavior: Behavior normal.     ED Results / Procedures / Treatments   Labs (all labs ordered are listed, but only abnormal results are displayed) Labs Reviewed  COMPREHENSIVE METABOLIC PANEL - Abnormal; Notable for the following components:      Result Value   Creatinine, Ser 1.08 (*)    GFR, Estimated 57 (*)    All other components within normal limits  CBC - Abnormal; Notable for the following components:   RBC 3.80 (*)    Hemoglobin 11.3 (*)    HCT 35.2 (*)    All other components within normal limits  LIPASE, BLOOD  URINALYSIS, ROUTINE W REFLEX MICROSCOPIC    EKG None  Radiology CT ABDOMEN PELVIS W CONTRAST  Result Date: 12/17/2021 CLINICAL DATA:  Left lower quadrant abdominal pain.  Flank pain. EXAM: CT ABDOMEN AND PELVIS WITH CONTRAST TECHNIQUE: Multidetector CT imaging of the abdomen and pelvis was performed using the standard protocol following bolus administration of intravenous contrast. RADIATION DOSE REDUCTION: This exam was performed according to the departmental dose-optimization program  which includes automated exposure control, adjustment of the mA and/or kV according to patient size and/or use of iterative reconstruction technique. CONTRAST:  81mL OMNIPAQUE IOHEXOL 300 MG/ML  SOLN COMPARISON:  02/13/2012 FINDINGS: Lower chest: Unremarkable. Hepatobiliary: Tiny hypodensities in the liver are too small to characterize but likely benign. There is no evidence for gallstones, gallbladder wall thickening, or pericholecystic fluid. No intrahepatic or extrahepatic biliary dilation. Pancreas: 6 mm low-density lesion in the uncinate process of the pancreas is stable since the 2013 exam consistent with benign etiology. No followup recommended. No main duct dilatation. Spleen: No splenomegaly. No focal mass lesion. Adrenals/Urinary Tract: No adrenal nodule or mass. Right kidney  unremarkable. Small benign simple cyst noted interpolar left kidney, not substantially changed in the interval. No evidence for hydroureter. The urinary bladder appears normal for the degree of distention. Stomach/Bowel: Stomach is unremarkable. No gastric wall thickening. No evidence of outlet obstruction. Duodenum is normally positioned as is the ligament of Treitz. No small bowel wall thickening. No small bowel dilatation. The terminal ileum is normal. The appendix is normal. No gross colonic mass. No colonic wall thickening. Vascular/Lymphatic: There is mild atherosclerotic calcification of the abdominal aorta without aneurysm. There is no gastrohepatic or hepatoduodenal ligament lymphadenopathy. No retroperitoneal or mesenteric lymphadenopathy. No pelvic sidewall lymphadenopathy. Reproductive: The uterus is surgically absent. There is no adnexal mass. Other: No intraperitoneal free fluid. Musculoskeletal: No worrisome lytic or sclerotic osseous abnormality. IMPRESSION: No acute findings in the abdomen or pelvis. Specifically, no findings to explain the patient's history of left lower quadrant pain. Aortic Atherosclerosis  (ICD10-I70.0). Electronically Signed   By: Kennith Center M.D.   On: 12/17/2021 12:05    Procedures Procedures    Medications Ordered in ED Medications  lactated ringers bolus 1,000 mL (0 mLs Intravenous Stopped 12/17/21 1239)  ondansetron (ZOFRAN) injection 4 mg (4 mg Intravenous Given 12/17/21 1114)  iohexol (OMNIPAQUE) 300 MG/ML solution 100 mL (85 mLs Intravenous Contrast Given 12/17/21 1140)    ED Course/ Medical Decision Making/ A&P                           Medical Decision Making Amount and/or Complexity of Data Reviewed Labs: ordered. Decision-making details documented in ED Course. Radiology: ordered and independent interpretation performed. Decision-making details documented in ED Course.  Risk Prescription drug management.   Pt with multiple medical problems and comorbidities and presenting today with a complaint that caries a high risk for morbidity and mortality.  Here today with left-sided abdomen and flank pain.  Concern for renal stone versus diverticulitis.  Low suspicion for ischemic bowel, perforation, appendicitis or UTI.  Patient is well-appearing on exam has no rebound or guarding.  I independently interpreted patient's labs today and CBC without acute findings, UA within normal limits, CMP within normal limits.  CT ordered to further evaluate.  Patient given IV fluids and nausea control.  12:48 PM I have independently visualized and interpreted pt's images today.  Appears to have a cyst on her left kidney on CT of the abdomen pelvis but no hydronephrosis.  No evidence of obstruction or diverticulitis.  Radiology reports no acute findings in the abdomen or pelvis.  Findings were discussed with the patient.  At this time she is feeling much better.  Concerned that she may have passed a kidney stone prior to getting here.  Did give her follow-up return precautions.         Final Clinical Impression(s) / ED Diagnoses Final diagnoses:  Abdominal pain, left lower  quadrant    Rx / DC Orders ED Discharge Orders     None         Gwyneth Sprout, MD 12/17/21 1248

## 2021-12-17 NOTE — ED Triage Notes (Signed)
Patient reports LLQ abdominal pain, diarrhea and nausea. Patient reports she was in [place] recently and tried to get in with her PCP today and was unable to

## 2021-12-17 NOTE — ED Notes (Signed)
Patient given discharge instructions. Questions were answered. Patient verbalized understanding of discharge instructions and care at home.  

## 2021-12-17 NOTE — Discharge Instructions (Addendum)
If you develop fever, severe pain that is not going away, urinary symptoms follow-up with your doctor or return to the emergency room.  Thankfully your blood work and your CAT scan today was normal.

## 2021-12-31 DIAGNOSIS — H401131 Primary open-angle glaucoma, bilateral, mild stage: Secondary | ICD-10-CM | POA: Diagnosis not present

## 2022-01-10 DIAGNOSIS — E7800 Pure hypercholesterolemia, unspecified: Secondary | ICD-10-CM | POA: Diagnosis not present

## 2022-01-10 DIAGNOSIS — M069 Rheumatoid arthritis, unspecified: Secondary | ICD-10-CM | POA: Diagnosis not present

## 2022-01-10 DIAGNOSIS — Z0000 Encounter for general adult medical examination without abnormal findings: Secondary | ICD-10-CM | POA: Diagnosis not present

## 2022-01-10 DIAGNOSIS — D6869 Other thrombophilia: Secondary | ICD-10-CM | POA: Diagnosis not present

## 2022-01-10 DIAGNOSIS — Z23 Encounter for immunization: Secondary | ICD-10-CM | POA: Diagnosis not present

## 2022-01-10 DIAGNOSIS — I471 Supraventricular tachycardia: Secondary | ICD-10-CM | POA: Diagnosis not present

## 2022-01-10 DIAGNOSIS — Z800 Family history of malignant neoplasm of digestive organs: Secondary | ICD-10-CM | POA: Diagnosis not present

## 2022-01-10 DIAGNOSIS — D649 Anemia, unspecified: Secondary | ICD-10-CM | POA: Diagnosis not present

## 2022-01-10 DIAGNOSIS — N1831 Chronic kidney disease, stage 3a: Secondary | ICD-10-CM | POA: Diagnosis not present

## 2022-01-10 DIAGNOSIS — I10 Essential (primary) hypertension: Secondary | ICD-10-CM | POA: Diagnosis not present

## 2022-01-10 DIAGNOSIS — I4891 Unspecified atrial fibrillation: Secondary | ICD-10-CM | POA: Diagnosis not present

## 2022-01-14 DIAGNOSIS — I4891 Unspecified atrial fibrillation: Secondary | ICD-10-CM | POA: Diagnosis not present

## 2022-01-14 DIAGNOSIS — Z800 Family history of malignant neoplasm of digestive organs: Secondary | ICD-10-CM | POA: Diagnosis not present

## 2022-01-14 DIAGNOSIS — Z7901 Long term (current) use of anticoagulants: Secondary | ICD-10-CM | POA: Diagnosis not present

## 2022-01-29 DIAGNOSIS — Z1211 Encounter for screening for malignant neoplasm of colon: Secondary | ICD-10-CM | POA: Diagnosis not present

## 2022-01-29 DIAGNOSIS — K648 Other hemorrhoids: Secondary | ICD-10-CM | POA: Diagnosis not present

## 2022-01-29 DIAGNOSIS — Z800 Family history of malignant neoplasm of digestive organs: Secondary | ICD-10-CM | POA: Diagnosis not present

## 2022-02-04 ENCOUNTER — Other Ambulatory Visit (HOSPITAL_BASED_OUTPATIENT_CLINIC_OR_DEPARTMENT_OTHER): Payer: Self-pay

## 2022-02-04 MED ORDER — COMIRNATY 30 MCG/0.3ML IM SUSY
PREFILLED_SYRINGE | INTRAMUSCULAR | 0 refills | Status: AC
  Filled 2022-02-04: qty 0.3, 1d supply, fill #0

## 2022-02-05 DIAGNOSIS — L409 Psoriasis, unspecified: Secondary | ICD-10-CM | POA: Diagnosis not present

## 2022-02-05 DIAGNOSIS — Z79899 Other long term (current) drug therapy: Secondary | ICD-10-CM | POA: Diagnosis not present

## 2022-02-05 DIAGNOSIS — Z6835 Body mass index (BMI) 35.0-35.9, adult: Secondary | ICD-10-CM | POA: Diagnosis not present

## 2022-02-05 DIAGNOSIS — M0579 Rheumatoid arthritis with rheumatoid factor of multiple sites without organ or systems involvement: Secondary | ICD-10-CM | POA: Diagnosis not present

## 2022-02-05 DIAGNOSIS — E669 Obesity, unspecified: Secondary | ICD-10-CM | POA: Diagnosis not present

## 2022-02-05 DIAGNOSIS — M1991 Primary osteoarthritis, unspecified site: Secondary | ICD-10-CM | POA: Diagnosis not present

## 2022-02-05 DIAGNOSIS — R110 Nausea: Secondary | ICD-10-CM | POA: Diagnosis not present

## 2022-02-05 DIAGNOSIS — K5190 Ulcerative colitis, unspecified, without complications: Secondary | ICD-10-CM | POA: Diagnosis not present

## 2022-02-05 DIAGNOSIS — M25511 Pain in right shoulder: Secondary | ICD-10-CM | POA: Diagnosis not present

## 2022-02-10 DIAGNOSIS — H25812 Combined forms of age-related cataract, left eye: Secondary | ICD-10-CM | POA: Diagnosis not present

## 2022-02-10 DIAGNOSIS — H401111 Primary open-angle glaucoma, right eye, mild stage: Secondary | ICD-10-CM | POA: Diagnosis not present

## 2022-02-10 DIAGNOSIS — H04123 Dry eye syndrome of bilateral lacrimal glands: Secondary | ICD-10-CM | POA: Diagnosis not present

## 2022-02-10 DIAGNOSIS — H401123 Primary open-angle glaucoma, left eye, severe stage: Secondary | ICD-10-CM | POA: Diagnosis not present

## 2022-02-25 DIAGNOSIS — I480 Paroxysmal atrial fibrillation: Secondary | ICD-10-CM | POA: Diagnosis not present

## 2022-02-25 DIAGNOSIS — I10 Essential (primary) hypertension: Secondary | ICD-10-CM | POA: Diagnosis not present

## 2022-02-25 DIAGNOSIS — Z23 Encounter for immunization: Secondary | ICD-10-CM | POA: Diagnosis not present

## 2022-03-04 DIAGNOSIS — I10 Essential (primary) hypertension: Secondary | ICD-10-CM | POA: Diagnosis not present

## 2022-03-04 DIAGNOSIS — I480 Paroxysmal atrial fibrillation: Secondary | ICD-10-CM | POA: Diagnosis not present

## 2022-03-25 DIAGNOSIS — H2512 Age-related nuclear cataract, left eye: Secondary | ICD-10-CM | POA: Diagnosis not present

## 2022-03-25 DIAGNOSIS — H401123 Primary open-angle glaucoma, left eye, severe stage: Secondary | ICD-10-CM | POA: Diagnosis not present

## 2022-03-31 ENCOUNTER — Other Ambulatory Visit (HOSPITAL_BASED_OUTPATIENT_CLINIC_OR_DEPARTMENT_OTHER): Payer: Self-pay

## 2022-03-31 MED ORDER — AREXVY 120 MCG/0.5ML IM SUSR
INTRAMUSCULAR | 0 refills | Status: AC
  Filled 2022-03-31: qty 0.5, 1d supply, fill #0

## 2022-04-14 DIAGNOSIS — H2511 Age-related nuclear cataract, right eye: Secondary | ICD-10-CM | POA: Diagnosis not present

## 2022-04-17 DIAGNOSIS — H401111 Primary open-angle glaucoma, right eye, mild stage: Secondary | ICD-10-CM | POA: Diagnosis not present

## 2022-04-17 DIAGNOSIS — H2511 Age-related nuclear cataract, right eye: Secondary | ICD-10-CM | POA: Diagnosis not present

## 2022-04-18 DIAGNOSIS — I480 Paroxysmal atrial fibrillation: Secondary | ICD-10-CM | POA: Diagnosis not present

## 2022-05-08 DIAGNOSIS — M0579 Rheumatoid arthritis with rheumatoid factor of multiple sites without organ or systems involvement: Secondary | ICD-10-CM | POA: Diagnosis not present

## 2022-06-09 ENCOUNTER — Encounter (HOSPITAL_BASED_OUTPATIENT_CLINIC_OR_DEPARTMENT_OTHER): Payer: Self-pay | Admitting: Emergency Medicine

## 2022-06-09 ENCOUNTER — Other Ambulatory Visit: Payer: Self-pay

## 2022-06-09 ENCOUNTER — Emergency Department (HOSPITAL_BASED_OUTPATIENT_CLINIC_OR_DEPARTMENT_OTHER)
Admission: EM | Admit: 2022-06-09 | Discharge: 2022-06-09 | Disposition: A | Discharge status: Home / Self Care | Payer: Medicare PPO | Attending: Emergency Medicine | Admitting: Emergency Medicine

## 2022-06-09 DIAGNOSIS — M62838 Other muscle spasm: Secondary | ICD-10-CM | POA: Diagnosis not present

## 2022-06-09 DIAGNOSIS — M79604 Pain in right leg: Secondary | ICD-10-CM | POA: Diagnosis present

## 2022-06-09 DIAGNOSIS — I4891 Unspecified atrial fibrillation: Secondary | ICD-10-CM | POA: Insufficient documentation

## 2022-06-09 DIAGNOSIS — Z7901 Long term (current) use of anticoagulants: Secondary | ICD-10-CM | POA: Insufficient documentation

## 2022-06-09 MED ORDER — CYCLOBENZAPRINE HCL 10 MG PO TABS: 10.0000 mg | ORAL_TABLET | Freq: Two times a day (BID) | ORAL | 0 refills | Status: AC | PRN

## 2022-06-09 NOTE — Discharge Instructions (Signed)
Recommend using Flexeril as needed for muscle spasms.  Do not mix with alcohol or drugs or dangerous activities as this medication is mildly sedating.

## 2022-06-09 NOTE — ED Notes (Signed)
Discharge paperwork given and verbally understood. 

## 2022-06-09 NOTE — ED Triage Notes (Signed)
Pt arrives to ED with c/o right leg spasms and pain that started 1.5 weeks ago.

## 2022-06-09 NOTE — ED Provider Notes (Signed)
[facility] Provider Note   CSN: [id number] Arrival date & time: 06/09/22  0730     History  Muscle spasms Marissa Torres is a 67 y.o. female.  Patient here for evaluation of muscle spasms in her right thigh.  History of A-fib on Xarelto, arthritis.  She is finishing a steroid taper for joint pain that has mostly resolved all of her joint pain except for some residual right knee and right thigh spasms.  She is concerned about maybe a blood clot.  She is having most of her spasms and discomfort in her right anterior thigh.  Denies any shortness of breath or chest pain.  No weakness numbness or tingling.  The history is provided by the patient.       Home Medications Prior to Admission medications   Medication Sig Start Date End Date Taking? Authorizing Provider  cyclobenzaprine (FLEXERIL) 10 MG tablet Take 1 tablet (10 mg total) by mouth 2 (two) times daily as needed for muscle spasms. 06/09/22  Yes Mariah Harn, DO  acetaminophen (TYLENOL) 650 MG CR tablet Take 1,300 mg by mouth daily.    [provider]  atenolol (TENORMIN) 50 MG tablet Take 50 mg by mouth daily. 11/12/19   [provider]  brimonidine (ALPHAGAN) 0.2 % ophthalmic solution  04/22/18   [provider]  brinzolamide (AZOPT) 1 % ophthalmic suspension Place 1 drop into both eyes 3 (three) times daily.     [provider]  COVID-19 mRNA bivalent vaccine, Pfizer, (PFIZER COVID-19 VAC BIVALENT) injection Inject into the muscle. 03/11/21   Carlyle Basques, MD  COVID-19 mRNA bivalent vaccine, Pfizer, (PFIZER COVID-19 VAC BIVALENT) injection Inject into the muscle. 09/06/21   Carlyle Basques, MD  COVID-19 mRNA vaccine [phone] (COMIRNATY) syringe Inject into the muscle. 02/04/22   Carlyle Basques, MD  COVID-19 mRNA vaccine, Pfizer, 30 MCG/0.3ML injection Inject into the muscle. 07/23/20   Carlyle Basques, MD  folic acid (FOLVITE) 1 MG tablet Take 2-3  mg by mouth See admin instructions. Take 3 mg by mouth daily on Thursday, Friday and Saturday. Take 2 mg by mouth daily on all other days.    [provider]  hydrochlorothiazide (HYDRODIURIL) 12.5 MG tablet Take 12.5 mg by mouth every morning. 11/12/19   [provider]  meloxicam (MOBIC) 15 MG tablet Take 15 mg by mouth daily.    [provider]  methotrexate (RHEUMATREX) 2.5 MG tablet Take 17.5 mg by mouth every Friday.  12/30/16   [provider]  methylPREDNISolone (MEDROL DOSEPAK) 4 MG TBPK tablet Take as directed on package 07/12/21   Wyvonnia Dusky, MD  rivaroxaban (XARELTO) 20 MG TABS tablet Take 1 tablet by mouth daily. 08/29/20   [provider]  RSV vaccine recomb adjuvanted (AREXVY) 120 MCG/0.5ML injection Inject into the muscle. 03/31/22   Carlyle Basques, MD  timolol (BETIMOL) 0.25 % ophthalmic solution Place 1 drop into both eyes 2 (two) times daily.    [provider]  timolol (TIMOPTIC) 0.5 % ophthalmic solution  04/30/18   [provider]  traMADol (ULTRAM) 50 MG tablet Take by mouth.    [provider]  Travoprost, BAK Free, (TRAVATAN) 0.004 % SOLN ophthalmic solution  04/22/18   [provider]  triamcinolone cream (KENALOG) 0.1 % Apply 1 application topically 2 (two) times daily as needed (for ecsema).  04/23/17   [provider]      Allergies    Combigan [brimonidine tartrate-timolol]  Review of Systems   Review of Systems  Physical Exam Updated Vital Signs BP (!) 140/93 (BP Location: Right Arm)   Pulse 81   Temp 98.3 F (36.8 C) (Oral)   Resp 15   Ht '5\' 4"'$  (1.626 m)   Wt 90.7 kg   SpO2 99%   BMI 34.33 kg/m  Physical Exam Vitals and nursing note reviewed.  Constitutional:      General: She is not in acute distress.    Appearance: She is well-developed. She is not ill-appearing.  HENT:     Head: Normocephalic and atraumatic.     Nose: Nose normal.     Mouth/Throat:      Mouth: Mucous membranes are moist.  Eyes:     Extraocular Movements: Extraocular movements intact.     Conjunctiva/sclera: Conjunctivae normal.     Pupils: Pupils are equal, round, and reactive to light.  Cardiovascular:     Rate and Rhythm: Normal rate and regular rhythm.     Pulses: Normal pulses.     Heart sounds: Normal heart sounds. No murmur heard. Pulmonary:     Effort: Pulmonary effort is normal. No respiratory distress.     Breath sounds: Normal breath sounds.  Abdominal:     Palpations: Abdomen is soft.     Tenderness: There is no abdominal tenderness.  Musculoskeletal:        General: No swelling or tenderness. Normal range of motion.     Cervical back: Normal range of motion and neck supple.  Skin:    General: Skin is warm and dry.     Capillary Refill: Capillary refill takes less than 2 seconds.  Neurological:     General: No focal deficit present.     Mental Status: She is alert and oriented to person, place, and time.     Cranial Nerves: No cranial nerve deficit.     Sensory: No sensory deficit.     Motor: No weakness.     Coordination: Coordination normal.  Psychiatric:        Mood and Affect: Mood normal.     ED Results / Procedures / Treatments   Labs (all labs ordered are listed, but only abnormal results are displayed) Labs Reviewed - No data to display  EKG None  Radiology No results found.  Procedures Procedures    Medications Ordered in ED Medications - No data to display  ED Course/ Medical Decision Making/ A&P                             Medical Decision Making Risk Prescription drug management.   Marissa Torres is here with muscle spasms in the right thigh.  Normal vitals.  No fever.  History of arthritis, hypertension, atrial fibrillation on Xarelto.  Neurovascular neuromuscular she is intact.  She has strong pulses in her lower extremities.  Have no concern for peripheral arterial issue.  Differential diagnosis is that this is  likely a muscle spasm.  Have no concern for blood clot or DVT she is on anticoagulation and history and physical is not consistent with form.  She is completing a steroid taper currently for her arthritis which I think will continue to help with her symptoms.  Will prescribe her muscle relaxant.  She has not had any major GI losses and I do not have any concern for electrolyte abnormalities or issues.  She has been using Voltaren gel as well which I think  is great.  Strongly recommend that she give this a little bit more time and try muscle relaxant and encouraged follow-up with primary care doctor if symptoms still persist.  Discharged in good condition.  Understands return precautions.  This chart was dictated using voice recognition software.  Despite best efforts to proofread,  errors can occur which can change the documentation meaning.         Final Clinical Impression(s) / ED Diagnoses Final diagnoses:  Muscle spasm    Rx / DC Orders ED Discharge Orders          Ordered    cyclobenzaprine (FLEXERIL) 10 MG tablet  2 times daily PRN        06/09/22 0745              Lennice Sites, DO 06/09/22 [id number]

## 2022-06-12 DIAGNOSIS — Z6836 Body mass index (BMI) 36.0-36.9, adult: Secondary | ICD-10-CM | POA: Diagnosis not present

## 2022-06-12 DIAGNOSIS — E669 Obesity, unspecified: Secondary | ICD-10-CM | POA: Diagnosis not present

## 2022-06-12 DIAGNOSIS — K5190 Ulcerative colitis, unspecified, without complications: Secondary | ICD-10-CM | POA: Diagnosis not present

## 2022-06-12 DIAGNOSIS — M1991 Primary osteoarthritis, unspecified site: Secondary | ICD-10-CM | POA: Diagnosis not present

## 2022-06-12 DIAGNOSIS — L409 Psoriasis, unspecified: Secondary | ICD-10-CM | POA: Diagnosis not present

## 2022-06-12 DIAGNOSIS — M25561 Pain in right knee: Secondary | ICD-10-CM | POA: Diagnosis not present

## 2022-06-12 DIAGNOSIS — M0579 Rheumatoid arthritis with rheumatoid factor of multiple sites without organ or systems involvement: Secondary | ICD-10-CM | POA: Diagnosis not present

## 2022-06-12 DIAGNOSIS — M25551 Pain in right hip: Secondary | ICD-10-CM | POA: Diagnosis not present

## 2022-06-26 DIAGNOSIS — M25461 Effusion, right knee: Secondary | ICD-10-CM | POA: Diagnosis not present

## 2022-06-26 DIAGNOSIS — M160 Bilateral primary osteoarthritis of hip: Secondary | ICD-10-CM | POA: Diagnosis not present

## 2022-06-26 DIAGNOSIS — M545 Low back pain, unspecified: Secondary | ICD-10-CM | POA: Diagnosis not present

## 2022-06-26 DIAGNOSIS — M1711 Unilateral primary osteoarthritis, right knee: Secondary | ICD-10-CM | POA: Diagnosis not present

## 2022-06-26 DIAGNOSIS — M25761 Osteophyte, right knee: Secondary | ICD-10-CM | POA: Diagnosis not present

## 2022-06-26 DIAGNOSIS — M25861 Other specified joint disorders, right knee: Secondary | ICD-10-CM | POA: Diagnosis not present

## 2022-06-26 DIAGNOSIS — M1611 Unilateral primary osteoarthritis, right hip: Secondary | ICD-10-CM | POA: Diagnosis not present

## 2022-06-30 DIAGNOSIS — M25551 Pain in right hip: Secondary | ICD-10-CM | POA: Diagnosis not present

## 2022-06-30 DIAGNOSIS — M1611 Unilateral primary osteoarthritis, right hip: Secondary | ICD-10-CM | POA: Diagnosis not present

## 2022-07-03 DIAGNOSIS — M5451 Vertebrogenic low back pain: Secondary | ICD-10-CM | POA: Diagnosis not present

## 2022-07-16 DIAGNOSIS — D638 Anemia in other chronic diseases classified elsewhere: Secondary | ICD-10-CM | POA: Diagnosis not present

## 2022-07-16 DIAGNOSIS — Z6835 Body mass index (BMI) 35.0-35.9, adult: Secondary | ICD-10-CM | POA: Diagnosis not present

## 2022-07-16 DIAGNOSIS — I700 Atherosclerosis of aorta: Secondary | ICD-10-CM | POA: Diagnosis not present

## 2022-07-16 DIAGNOSIS — I4891 Unspecified atrial fibrillation: Secondary | ICD-10-CM | POA: Diagnosis not present

## 2022-07-16 DIAGNOSIS — D6869 Other thrombophilia: Secondary | ICD-10-CM | POA: Diagnosis not present

## 2022-07-16 DIAGNOSIS — I10 Essential (primary) hypertension: Secondary | ICD-10-CM | POA: Diagnosis not present

## 2022-07-16 DIAGNOSIS — N1831 Chronic kidney disease, stage 3a: Secondary | ICD-10-CM | POA: Diagnosis not present

## 2022-07-16 DIAGNOSIS — M069 Rheumatoid arthritis, unspecified: Secondary | ICD-10-CM | POA: Diagnosis not present

## 2022-07-21 DIAGNOSIS — H401131 Primary open-angle glaucoma, bilateral, mild stage: Secondary | ICD-10-CM | POA: Diagnosis not present

## 2022-07-24 DIAGNOSIS — M1611 Unilateral primary osteoarthritis, right hip: Secondary | ICD-10-CM | POA: Diagnosis not present

## 2022-07-24 DIAGNOSIS — M1711 Unilateral primary osteoarthritis, right knee: Secondary | ICD-10-CM | POA: Diagnosis not present

## 2022-07-24 DIAGNOSIS — G8929 Other chronic pain: Secondary | ICD-10-CM | POA: Diagnosis not present

## 2022-07-24 DIAGNOSIS — M545 Low back pain, unspecified: Secondary | ICD-10-CM | POA: Diagnosis not present

## 2022-07-25 DIAGNOSIS — Z79899 Other long term (current) drug therapy: Secondary | ICD-10-CM | POA: Diagnosis not present

## 2022-07-25 DIAGNOSIS — L409 Psoriasis, unspecified: Secondary | ICD-10-CM | POA: Diagnosis not present

## 2022-07-25 DIAGNOSIS — E669 Obesity, unspecified: Secondary | ICD-10-CM | POA: Diagnosis not present

## 2022-07-25 DIAGNOSIS — K5190 Ulcerative colitis, unspecified, without complications: Secondary | ICD-10-CM | POA: Diagnosis not present

## 2022-07-25 DIAGNOSIS — Z6834 Body mass index (BMI) 34.0-34.9, adult: Secondary | ICD-10-CM | POA: Diagnosis not present

## 2022-07-25 DIAGNOSIS — M1991 Primary osteoarthritis, unspecified site: Secondary | ICD-10-CM | POA: Diagnosis not present

## 2022-07-25 DIAGNOSIS — M0579 Rheumatoid arthritis with rheumatoid factor of multiple sites without organ or systems involvement: Secondary | ICD-10-CM | POA: Diagnosis not present

## 2022-08-12 DIAGNOSIS — I471 Supraventricular tachycardia, unspecified: Secondary | ICD-10-CM | POA: Diagnosis not present

## 2022-08-12 DIAGNOSIS — I10 Essential (primary) hypertension: Secondary | ICD-10-CM | POA: Diagnosis not present

## 2022-08-12 DIAGNOSIS — I480 Paroxysmal atrial fibrillation: Secondary | ICD-10-CM | POA: Diagnosis not present

## 2022-08-14 DIAGNOSIS — M5451 Vertebrogenic low back pain: Secondary | ICD-10-CM | POA: Diagnosis not present

## 2022-08-25 DIAGNOSIS — M5416 Radiculopathy, lumbar region: Secondary | ICD-10-CM | POA: Diagnosis not present

## 2022-08-25 DIAGNOSIS — M1611 Unilateral primary osteoarthritis, right hip: Secondary | ICD-10-CM | POA: Diagnosis not present

## 2022-08-26 DIAGNOSIS — M5116 Intervertebral disc disorders with radiculopathy, lumbar region: Secondary | ICD-10-CM | POA: Diagnosis not present

## 2022-08-26 DIAGNOSIS — M5416 Radiculopathy, lumbar region: Secondary | ICD-10-CM | POA: Diagnosis not present

## 2022-08-26 DIAGNOSIS — M48061 Spinal stenosis, lumbar region without neurogenic claudication: Secondary | ICD-10-CM | POA: Diagnosis not present

## 2022-09-09 DIAGNOSIS — H401122 Primary open-angle glaucoma, left eye, moderate stage: Secondary | ICD-10-CM | POA: Diagnosis not present

## 2022-09-16 DIAGNOSIS — M1611 Unilateral primary osteoarthritis, right hip: Secondary | ICD-10-CM | POA: Diagnosis not present

## 2022-10-07 DIAGNOSIS — Z6835 Body mass index (BMI) 35.0-35.9, adult: Secondary | ICD-10-CM | POA: Diagnosis not present

## 2022-10-07 DIAGNOSIS — I700 Atherosclerosis of aorta: Secondary | ICD-10-CM | POA: Diagnosis not present

## 2022-10-07 DIAGNOSIS — I10 Essential (primary) hypertension: Secondary | ICD-10-CM | POA: Diagnosis not present

## 2022-10-07 DIAGNOSIS — E7800 Pure hypercholesterolemia, unspecified: Secondary | ICD-10-CM | POA: Diagnosis not present

## 2022-10-07 DIAGNOSIS — Z79899 Other long term (current) drug therapy: Secondary | ICD-10-CM | POA: Diagnosis not present

## 2022-10-07 DIAGNOSIS — M069 Rheumatoid arthritis, unspecified: Secondary | ICD-10-CM | POA: Diagnosis not present

## 2022-10-07 DIAGNOSIS — N1831 Chronic kidney disease, stage 3a: Secondary | ICD-10-CM | POA: Diagnosis not present

## 2022-10-07 DIAGNOSIS — Z131 Encounter for screening for diabetes mellitus: Secondary | ICD-10-CM | POA: Diagnosis not present

## 2022-10-07 DIAGNOSIS — E8889 Other specified metabolic disorders: Secondary | ICD-10-CM | POA: Diagnosis not present

## 2022-10-07 DIAGNOSIS — I4891 Unspecified atrial fibrillation: Secondary | ICD-10-CM | POA: Diagnosis not present

## 2022-10-21 DIAGNOSIS — Z8669 Personal history of other diseases of the nervous system and sense organs: Secondary | ICD-10-CM | POA: Diagnosis not present

## 2022-10-21 DIAGNOSIS — M069 Rheumatoid arthritis, unspecified: Secondary | ICD-10-CM | POA: Diagnosis not present

## 2022-10-21 DIAGNOSIS — E7800 Pure hypercholesterolemia, unspecified: Secondary | ICD-10-CM | POA: Diagnosis not present

## 2022-10-21 DIAGNOSIS — F5081 Binge eating disorder: Secondary | ICD-10-CM | POA: Diagnosis not present

## 2022-10-21 DIAGNOSIS — I700 Atherosclerosis of aorta: Secondary | ICD-10-CM | POA: Diagnosis not present

## 2022-10-21 DIAGNOSIS — I10 Essential (primary) hypertension: Secondary | ICD-10-CM | POA: Diagnosis not present

## 2022-10-21 DIAGNOSIS — N1831 Chronic kidney disease, stage 3a: Secondary | ICD-10-CM | POA: Diagnosis not present

## 2022-10-21 DIAGNOSIS — Z6835 Body mass index (BMI) 35.0-35.9, adult: Secondary | ICD-10-CM | POA: Diagnosis not present

## 2022-10-21 DIAGNOSIS — I4891 Unspecified atrial fibrillation: Secondary | ICD-10-CM | POA: Diagnosis not present

## 2022-10-28 DIAGNOSIS — K5190 Ulcerative colitis, unspecified, without complications: Secondary | ICD-10-CM | POA: Diagnosis not present

## 2022-10-28 DIAGNOSIS — Z79899 Other long term (current) drug therapy: Secondary | ICD-10-CM | POA: Diagnosis not present

## 2022-10-28 DIAGNOSIS — L409 Psoriasis, unspecified: Secondary | ICD-10-CM | POA: Diagnosis not present

## 2022-10-28 DIAGNOSIS — Z6834 Body mass index (BMI) 34.0-34.9, adult: Secondary | ICD-10-CM | POA: Diagnosis not present

## 2022-10-28 DIAGNOSIS — E669 Obesity, unspecified: Secondary | ICD-10-CM | POA: Diagnosis not present

## 2022-10-28 DIAGNOSIS — M0579 Rheumatoid arthritis with rheumatoid factor of multiple sites without organ or systems involvement: Secondary | ICD-10-CM | POA: Diagnosis not present

## 2022-10-28 DIAGNOSIS — M1991 Primary osteoarthritis, unspecified site: Secondary | ICD-10-CM | POA: Diagnosis not present

## 2022-10-30 ENCOUNTER — Other Ambulatory Visit: Payer: Self-pay

## 2022-10-31 ENCOUNTER — Other Ambulatory Visit (HOSPITAL_COMMUNITY): Payer: Self-pay

## 2022-10-31 MED ORDER — WEGOVY 0.25 MG/0.5ML ~~LOC~~ SOAJ
0.2500 mg | SUBCUTANEOUS | 0 refills | Status: AC
  Filled 2022-10-31: qty 2, 28d supply, fill #0

## 2022-11-04 DIAGNOSIS — E7800 Pure hypercholesterolemia, unspecified: Secondary | ICD-10-CM | POA: Diagnosis not present

## 2022-11-04 DIAGNOSIS — M069 Rheumatoid arthritis, unspecified: Secondary | ICD-10-CM | POA: Diagnosis not present

## 2022-11-04 DIAGNOSIS — I4891 Unspecified atrial fibrillation: Secondary | ICD-10-CM | POA: Diagnosis not present

## 2022-11-04 DIAGNOSIS — I700 Atherosclerosis of aorta: Secondary | ICD-10-CM | POA: Diagnosis not present

## 2022-11-04 DIAGNOSIS — Z8669 Personal history of other diseases of the nervous system and sense organs: Secondary | ICD-10-CM | POA: Diagnosis not present

## 2022-11-04 DIAGNOSIS — F5081 Binge eating disorder: Secondary | ICD-10-CM | POA: Diagnosis not present

## 2022-11-04 DIAGNOSIS — I10 Essential (primary) hypertension: Secondary | ICD-10-CM | POA: Diagnosis not present

## 2022-11-04 DIAGNOSIS — N1831 Chronic kidney disease, stage 3a: Secondary | ICD-10-CM | POA: Diagnosis not present

## 2022-11-05 DIAGNOSIS — M1611 Unilateral primary osteoarthritis, right hip: Secondary | ICD-10-CM | POA: Diagnosis not present

## 2022-11-26 DIAGNOSIS — F5081 Binge eating disorder: Secondary | ICD-10-CM | POA: Diagnosis not present

## 2022-11-26 DIAGNOSIS — Z6835 Body mass index (BMI) 35.0-35.9, adult: Secondary | ICD-10-CM | POA: Diagnosis not present

## 2022-11-26 DIAGNOSIS — E7800 Pure hypercholesterolemia, unspecified: Secondary | ICD-10-CM | POA: Diagnosis not present

## 2022-11-26 DIAGNOSIS — I4891 Unspecified atrial fibrillation: Secondary | ICD-10-CM | POA: Diagnosis not present

## 2022-11-26 DIAGNOSIS — Z8669 Personal history of other diseases of the nervous system and sense organs: Secondary | ICD-10-CM | POA: Diagnosis not present

## 2022-11-26 DIAGNOSIS — M069 Rheumatoid arthritis, unspecified: Secondary | ICD-10-CM | POA: Diagnosis not present

## 2022-11-26 DIAGNOSIS — M160 Bilateral primary osteoarthritis of hip: Secondary | ICD-10-CM | POA: Diagnosis not present

## 2022-11-26 DIAGNOSIS — M170 Bilateral primary osteoarthritis of knee: Secondary | ICD-10-CM | POA: Diagnosis not present

## 2022-11-26 DIAGNOSIS — N1831 Chronic kidney disease, stage 3a: Secondary | ICD-10-CM | POA: Diagnosis not present

## 2022-11-26 DIAGNOSIS — I10 Essential (primary) hypertension: Secondary | ICD-10-CM | POA: Diagnosis not present

## 2022-11-26 DIAGNOSIS — I700 Atherosclerosis of aorta: Secondary | ICD-10-CM | POA: Diagnosis not present

## 2022-11-27 ENCOUNTER — Other Ambulatory Visit (HOSPITAL_COMMUNITY): Payer: Self-pay

## 2022-11-27 MED ORDER — WEGOVY 0.5 MG/0.5ML ~~LOC~~ SOAJ
0.5000 mg | SUBCUTANEOUS | 0 refills | Status: DC
  Filled 2022-11-27: qty 2, 28d supply, fill #0

## 2022-12-16 ENCOUNTER — Other Ambulatory Visit: Payer: Self-pay | Admitting: Internal Medicine

## 2022-12-16 DIAGNOSIS — Z1231 Encounter for screening mammogram for malignant neoplasm of breast: Secondary | ICD-10-CM

## 2022-12-17 DIAGNOSIS — I4891 Unspecified atrial fibrillation: Secondary | ICD-10-CM | POA: Diagnosis not present

## 2022-12-17 DIAGNOSIS — M159 Polyosteoarthritis, unspecified: Secondary | ICD-10-CM | POA: Diagnosis not present

## 2022-12-17 DIAGNOSIS — D638 Anemia in other chronic diseases classified elsewhere: Secondary | ICD-10-CM | POA: Diagnosis not present

## 2022-12-17 DIAGNOSIS — M069 Rheumatoid arthritis, unspecified: Secondary | ICD-10-CM | POA: Diagnosis not present

## 2022-12-17 DIAGNOSIS — I10 Essential (primary) hypertension: Secondary | ICD-10-CM | POA: Diagnosis not present

## 2022-12-17 DIAGNOSIS — Z01818 Encounter for other preprocedural examination: Secondary | ICD-10-CM | POA: Diagnosis not present

## 2022-12-17 DIAGNOSIS — D6869 Other thrombophilia: Secondary | ICD-10-CM | POA: Diagnosis not present

## 2022-12-22 ENCOUNTER — Other Ambulatory Visit: Payer: Self-pay

## 2022-12-22 ENCOUNTER — Other Ambulatory Visit (HOSPITAL_COMMUNITY): Payer: Self-pay

## 2022-12-22 DIAGNOSIS — I700 Atherosclerosis of aorta: Secondary | ICD-10-CM | POA: Diagnosis not present

## 2022-12-22 DIAGNOSIS — I10 Essential (primary) hypertension: Secondary | ICD-10-CM | POA: Diagnosis not present

## 2022-12-22 DIAGNOSIS — N1831 Chronic kidney disease, stage 3a: Secondary | ICD-10-CM | POA: Diagnosis not present

## 2022-12-22 DIAGNOSIS — I4891 Unspecified atrial fibrillation: Secondary | ICD-10-CM | POA: Diagnosis not present

## 2022-12-22 DIAGNOSIS — M069 Rheumatoid arthritis, unspecified: Secondary | ICD-10-CM | POA: Diagnosis not present

## 2022-12-22 DIAGNOSIS — Z8669 Personal history of other diseases of the nervous system and sense organs: Secondary | ICD-10-CM | POA: Diagnosis not present

## 2022-12-22 DIAGNOSIS — E7800 Pure hypercholesterolemia, unspecified: Secondary | ICD-10-CM | POA: Diagnosis not present

## 2022-12-22 DIAGNOSIS — F5081 Binge eating disorder: Secondary | ICD-10-CM | POA: Diagnosis not present

## 2022-12-22 MED ORDER — WEGOVY 0.5 MG/0.5ML ~~LOC~~ SOAJ
0.5000 mg | SUBCUTANEOUS | 0 refills | Status: DC
  Filled 2022-12-22: qty 2, 28d supply, fill #0

## 2022-12-24 ENCOUNTER — Ambulatory Visit
Admission: RE | Admit: 2022-12-24 | Discharge: 2022-12-24 | Disposition: A | Discharge status: Home / Self Care | Payer: Medicare PPO | Source: Ambulatory Visit | Attending: Internal Medicine | Admitting: Internal Medicine

## 2022-12-24 DIAGNOSIS — Z1231 Encounter for screening mammogram for malignant neoplasm of breast: Secondary | ICD-10-CM | POA: Diagnosis not present

## 2022-12-26 DIAGNOSIS — M160 Bilateral primary osteoarthritis of hip: Secondary | ICD-10-CM | POA: Diagnosis not present

## 2022-12-26 DIAGNOSIS — E669 Obesity, unspecified: Secondary | ICD-10-CM | POA: Diagnosis not present

## 2022-12-26 DIAGNOSIS — I129 Hypertensive chronic kidney disease with stage 1 through stage 4 chronic kidney disease, or unspecified chronic kidney disease: Secondary | ICD-10-CM | POA: Diagnosis not present

## 2022-12-26 DIAGNOSIS — M1611 Unilateral primary osteoarthritis, right hip: Secondary | ICD-10-CM | POA: Diagnosis not present

## 2022-12-26 DIAGNOSIS — N1831 Chronic kidney disease, stage 3a: Secondary | ICD-10-CM | POA: Diagnosis not present

## 2022-12-26 DIAGNOSIS — I480 Paroxysmal atrial fibrillation: Secondary | ICD-10-CM | POA: Diagnosis not present

## 2022-12-26 DIAGNOSIS — I471 Supraventricular tachycardia, unspecified: Secondary | ICD-10-CM | POA: Diagnosis not present

## 2022-12-26 DIAGNOSIS — I10 Essential (primary) hypertension: Secondary | ICD-10-CM | POA: Diagnosis not present

## 2022-12-26 DIAGNOSIS — M069 Rheumatoid arthritis, unspecified: Secondary | ICD-10-CM | POA: Diagnosis not present

## 2022-12-26 DIAGNOSIS — Z01818 Encounter for other preprocedural examination: Secondary | ICD-10-CM | POA: Diagnosis not present

## 2022-12-26 DIAGNOSIS — D638 Anemia in other chronic diseases classified elsewhere: Secondary | ICD-10-CM | POA: Diagnosis not present

## 2022-12-30 DIAGNOSIS — H401122 Primary open-angle glaucoma, left eye, moderate stage: Secondary | ICD-10-CM | POA: Diagnosis not present

## 2023-01-08 DIAGNOSIS — I10 Essential (primary) hypertension: Secondary | ICD-10-CM | POA: Diagnosis not present

## 2023-01-08 DIAGNOSIS — D638 Anemia in other chronic diseases classified elsewhere: Secondary | ICD-10-CM | POA: Diagnosis not present

## 2023-01-08 DIAGNOSIS — M069 Rheumatoid arthritis, unspecified: Secondary | ICD-10-CM | POA: Diagnosis not present

## 2023-01-08 DIAGNOSIS — Z888 Allergy status to other drugs, medicaments and biological substances status: Secondary | ICD-10-CM | POA: Diagnosis not present

## 2023-01-08 DIAGNOSIS — D631 Anemia in chronic kidney disease: Secondary | ICD-10-CM | POA: Diagnosis not present

## 2023-01-08 DIAGNOSIS — Z471 Aftercare following joint replacement surgery: Secondary | ICD-10-CM | POA: Diagnosis not present

## 2023-01-08 DIAGNOSIS — E669 Obesity, unspecified: Secondary | ICD-10-CM | POA: Diagnosis not present

## 2023-01-08 DIAGNOSIS — N1831 Chronic kidney disease, stage 3a: Secondary | ICD-10-CM | POA: Diagnosis not present

## 2023-01-08 DIAGNOSIS — M1611 Unilateral primary osteoarthritis, right hip: Secondary | ICD-10-CM | POA: Diagnosis not present

## 2023-01-08 DIAGNOSIS — I480 Paroxysmal atrial fibrillation: Secondary | ICD-10-CM | POA: Diagnosis not present

## 2023-01-08 DIAGNOSIS — I471 Supraventricular tachycardia, unspecified: Secondary | ICD-10-CM | POA: Diagnosis not present

## 2023-01-08 DIAGNOSIS — Z6834 Body mass index (BMI) 34.0-34.9, adult: Secondary | ICD-10-CM | POA: Diagnosis not present

## 2023-01-08 DIAGNOSIS — Z96641 Presence of right artificial hip joint: Secondary | ICD-10-CM | POA: Diagnosis not present

## 2023-01-08 DIAGNOSIS — I129 Hypertensive chronic kidney disease with stage 1 through stage 4 chronic kidney disease, or unspecified chronic kidney disease: Secondary | ICD-10-CM | POA: Diagnosis not present

## 2023-01-09 DIAGNOSIS — I10 Essential (primary) hypertension: Secondary | ICD-10-CM | POA: Diagnosis not present

## 2023-01-09 DIAGNOSIS — N1831 Chronic kidney disease, stage 3a: Secondary | ICD-10-CM | POA: Diagnosis not present

## 2023-01-09 DIAGNOSIS — M1611 Unilateral primary osteoarthritis, right hip: Secondary | ICD-10-CM | POA: Diagnosis not present

## 2023-01-09 DIAGNOSIS — I480 Paroxysmal atrial fibrillation: Secondary | ICD-10-CM | POA: Diagnosis not present

## 2023-01-09 DIAGNOSIS — D638 Anemia in other chronic diseases classified elsewhere: Secondary | ICD-10-CM | POA: Diagnosis not present

## 2023-01-12 DIAGNOSIS — N1831 Chronic kidney disease, stage 3a: Secondary | ICD-10-CM | POA: Diagnosis not present

## 2023-01-12 DIAGNOSIS — I471 Supraventricular tachycardia, unspecified: Secondary | ICD-10-CM | POA: Diagnosis not present

## 2023-01-12 DIAGNOSIS — I480 Paroxysmal atrial fibrillation: Secondary | ICD-10-CM | POA: Diagnosis not present

## 2023-01-12 DIAGNOSIS — M159 Polyosteoarthritis, unspecified: Secondary | ICD-10-CM | POA: Diagnosis not present

## 2023-01-12 DIAGNOSIS — D631 Anemia in chronic kidney disease: Secondary | ICD-10-CM | POA: Diagnosis not present

## 2023-01-12 DIAGNOSIS — I129 Hypertensive chronic kidney disease with stage 1 through stage 4 chronic kidney disease, or unspecified chronic kidney disease: Secondary | ICD-10-CM | POA: Diagnosis not present

## 2023-01-12 DIAGNOSIS — M0689 Other specified rheumatoid arthritis, multiple sites: Secondary | ICD-10-CM | POA: Diagnosis not present

## 2023-01-12 DIAGNOSIS — Z471 Aftercare following joint replacement surgery: Secondary | ICD-10-CM | POA: Diagnosis not present

## 2023-01-12 DIAGNOSIS — G8929 Other chronic pain: Secondary | ICD-10-CM | POA: Diagnosis not present

## 2023-01-14 DIAGNOSIS — I471 Supraventricular tachycardia, unspecified: Secondary | ICD-10-CM | POA: Diagnosis not present

## 2023-01-14 DIAGNOSIS — D631 Anemia in chronic kidney disease: Secondary | ICD-10-CM | POA: Diagnosis not present

## 2023-01-14 DIAGNOSIS — M0689 Other specified rheumatoid arthritis, multiple sites: Secondary | ICD-10-CM | POA: Diagnosis not present

## 2023-01-14 DIAGNOSIS — M159 Polyosteoarthritis, unspecified: Secondary | ICD-10-CM | POA: Diagnosis not present

## 2023-01-14 DIAGNOSIS — G8929 Other chronic pain: Secondary | ICD-10-CM | POA: Diagnosis not present

## 2023-01-14 DIAGNOSIS — I480 Paroxysmal atrial fibrillation: Secondary | ICD-10-CM | POA: Diagnosis not present

## 2023-01-14 DIAGNOSIS — I129 Hypertensive chronic kidney disease with stage 1 through stage 4 chronic kidney disease, or unspecified chronic kidney disease: Secondary | ICD-10-CM | POA: Diagnosis not present

## 2023-01-14 DIAGNOSIS — Z471 Aftercare following joint replacement surgery: Secondary | ICD-10-CM | POA: Diagnosis not present

## 2023-01-14 DIAGNOSIS — N1831 Chronic kidney disease, stage 3a: Secondary | ICD-10-CM | POA: Diagnosis not present

## 2023-01-16 DIAGNOSIS — I480 Paroxysmal atrial fibrillation: Secondary | ICD-10-CM | POA: Diagnosis not present

## 2023-01-16 DIAGNOSIS — M159 Polyosteoarthritis, unspecified: Secondary | ICD-10-CM | POA: Diagnosis not present

## 2023-01-16 DIAGNOSIS — D631 Anemia in chronic kidney disease: Secondary | ICD-10-CM | POA: Diagnosis not present

## 2023-01-16 DIAGNOSIS — N1831 Chronic kidney disease, stage 3a: Secondary | ICD-10-CM | POA: Diagnosis not present

## 2023-01-16 DIAGNOSIS — Z471 Aftercare following joint replacement surgery: Secondary | ICD-10-CM | POA: Diagnosis not present

## 2023-01-16 DIAGNOSIS — I129 Hypertensive chronic kidney disease with stage 1 through stage 4 chronic kidney disease, or unspecified chronic kidney disease: Secondary | ICD-10-CM | POA: Diagnosis not present

## 2023-01-16 DIAGNOSIS — I471 Supraventricular tachycardia, unspecified: Secondary | ICD-10-CM | POA: Diagnosis not present

## 2023-01-16 DIAGNOSIS — G8929 Other chronic pain: Secondary | ICD-10-CM | POA: Diagnosis not present

## 2023-01-16 DIAGNOSIS — M0689 Other specified rheumatoid arthritis, multiple sites: Secondary | ICD-10-CM | POA: Diagnosis not present

## 2023-01-19 DIAGNOSIS — G8929 Other chronic pain: Secondary | ICD-10-CM | POA: Diagnosis not present

## 2023-01-19 DIAGNOSIS — M0689 Other specified rheumatoid arthritis, multiple sites: Secondary | ICD-10-CM | POA: Diagnosis not present

## 2023-01-19 DIAGNOSIS — Z471 Aftercare following joint replacement surgery: Secondary | ICD-10-CM | POA: Diagnosis not present

## 2023-01-19 DIAGNOSIS — I471 Supraventricular tachycardia, unspecified: Secondary | ICD-10-CM | POA: Diagnosis not present

## 2023-01-19 DIAGNOSIS — N1831 Chronic kidney disease, stage 3a: Secondary | ICD-10-CM | POA: Diagnosis not present

## 2023-01-19 DIAGNOSIS — I129 Hypertensive chronic kidney disease with stage 1 through stage 4 chronic kidney disease, or unspecified chronic kidney disease: Secondary | ICD-10-CM | POA: Diagnosis not present

## 2023-01-19 DIAGNOSIS — M159 Polyosteoarthritis, unspecified: Secondary | ICD-10-CM | POA: Diagnosis not present

## 2023-01-19 DIAGNOSIS — I480 Paroxysmal atrial fibrillation: Secondary | ICD-10-CM | POA: Diagnosis not present

## 2023-01-19 DIAGNOSIS — D631 Anemia in chronic kidney disease: Secondary | ICD-10-CM | POA: Diagnosis not present

## 2023-01-20 ENCOUNTER — Other Ambulatory Visit (HOSPITAL_COMMUNITY): Payer: Self-pay

## 2023-01-20 ENCOUNTER — Other Ambulatory Visit: Payer: Self-pay

## 2023-01-20 DIAGNOSIS — Z8669 Personal history of other diseases of the nervous system and sense organs: Secondary | ICD-10-CM | POA: Diagnosis not present

## 2023-01-20 DIAGNOSIS — N1831 Chronic kidney disease, stage 3a: Secondary | ICD-10-CM | POA: Diagnosis not present

## 2023-01-20 DIAGNOSIS — I4891 Unspecified atrial fibrillation: Secondary | ICD-10-CM | POA: Diagnosis not present

## 2023-01-20 DIAGNOSIS — F5081 Binge eating disorder, mild: Secondary | ICD-10-CM | POA: Diagnosis not present

## 2023-01-20 DIAGNOSIS — Z6831 Body mass index (BMI) 31.0-31.9, adult: Secondary | ICD-10-CM | POA: Diagnosis not present

## 2023-01-20 DIAGNOSIS — I700 Atherosclerosis of aorta: Secondary | ICD-10-CM | POA: Diagnosis not present

## 2023-01-20 DIAGNOSIS — E7800 Pure hypercholesterolemia, unspecified: Secondary | ICD-10-CM | POA: Diagnosis not present

## 2023-01-20 DIAGNOSIS — I10 Essential (primary) hypertension: Secondary | ICD-10-CM | POA: Diagnosis not present

## 2023-01-20 DIAGNOSIS — M069 Rheumatoid arthritis, unspecified: Secondary | ICD-10-CM | POA: Diagnosis not present

## 2023-01-20 MED ORDER — WEGOVY 0.5 MG/0.5ML ~~LOC~~ SOAJ
0.5000 mg | SUBCUTANEOUS | 0 refills | Status: AC
  Filled 2023-01-20: qty 2, 28d supply, fill #0

## 2023-01-21 DIAGNOSIS — M0689 Other specified rheumatoid arthritis, multiple sites: Secondary | ICD-10-CM | POA: Diagnosis not present

## 2023-01-21 DIAGNOSIS — N1831 Chronic kidney disease, stage 3a: Secondary | ICD-10-CM | POA: Diagnosis not present

## 2023-01-21 DIAGNOSIS — I471 Supraventricular tachycardia, unspecified: Secondary | ICD-10-CM | POA: Diagnosis not present

## 2023-01-21 DIAGNOSIS — M159 Polyosteoarthritis, unspecified: Secondary | ICD-10-CM | POA: Diagnosis not present

## 2023-01-21 DIAGNOSIS — D631 Anemia in chronic kidney disease: Secondary | ICD-10-CM | POA: Diagnosis not present

## 2023-01-21 DIAGNOSIS — I480 Paroxysmal atrial fibrillation: Secondary | ICD-10-CM | POA: Diagnosis not present

## 2023-01-21 DIAGNOSIS — G8929 Other chronic pain: Secondary | ICD-10-CM | POA: Diagnosis not present

## 2023-01-21 DIAGNOSIS — Z471 Aftercare following joint replacement surgery: Secondary | ICD-10-CM | POA: Diagnosis not present

## 2023-01-21 DIAGNOSIS — I129 Hypertensive chronic kidney disease with stage 1 through stage 4 chronic kidney disease, or unspecified chronic kidney disease: Secondary | ICD-10-CM | POA: Diagnosis not present

## 2023-01-23 DIAGNOSIS — I471 Supraventricular tachycardia, unspecified: Secondary | ICD-10-CM | POA: Diagnosis not present

## 2023-01-23 DIAGNOSIS — G8929 Other chronic pain: Secondary | ICD-10-CM | POA: Diagnosis not present

## 2023-01-23 DIAGNOSIS — M0689 Other specified rheumatoid arthritis, multiple sites: Secondary | ICD-10-CM | POA: Diagnosis not present

## 2023-01-23 DIAGNOSIS — M159 Polyosteoarthritis, unspecified: Secondary | ICD-10-CM | POA: Diagnosis not present

## 2023-01-23 DIAGNOSIS — Z471 Aftercare following joint replacement surgery: Secondary | ICD-10-CM | POA: Diagnosis not present

## 2023-01-23 DIAGNOSIS — I480 Paroxysmal atrial fibrillation: Secondary | ICD-10-CM | POA: Diagnosis not present

## 2023-01-23 DIAGNOSIS — N1831 Chronic kidney disease, stage 3a: Secondary | ICD-10-CM | POA: Diagnosis not present

## 2023-01-23 DIAGNOSIS — D631 Anemia in chronic kidney disease: Secondary | ICD-10-CM | POA: Diagnosis not present

## 2023-01-23 DIAGNOSIS — I129 Hypertensive chronic kidney disease with stage 1 through stage 4 chronic kidney disease, or unspecified chronic kidney disease: Secondary | ICD-10-CM | POA: Diagnosis not present

## 2023-01-26 DIAGNOSIS — D631 Anemia in chronic kidney disease: Secondary | ICD-10-CM | POA: Diagnosis not present

## 2023-01-26 DIAGNOSIS — G8929 Other chronic pain: Secondary | ICD-10-CM | POA: Diagnosis not present

## 2023-01-26 DIAGNOSIS — M159 Polyosteoarthritis, unspecified: Secondary | ICD-10-CM | POA: Diagnosis not present

## 2023-01-26 DIAGNOSIS — N1831 Chronic kidney disease, stage 3a: Secondary | ICD-10-CM | POA: Diagnosis not present

## 2023-01-26 DIAGNOSIS — I480 Paroxysmal atrial fibrillation: Secondary | ICD-10-CM | POA: Diagnosis not present

## 2023-01-26 DIAGNOSIS — I129 Hypertensive chronic kidney disease with stage 1 through stage 4 chronic kidney disease, or unspecified chronic kidney disease: Secondary | ICD-10-CM | POA: Diagnosis not present

## 2023-01-26 DIAGNOSIS — I471 Supraventricular tachycardia, unspecified: Secondary | ICD-10-CM | POA: Diagnosis not present

## 2023-01-26 DIAGNOSIS — M0689 Other specified rheumatoid arthritis, multiple sites: Secondary | ICD-10-CM | POA: Diagnosis not present

## 2023-01-26 DIAGNOSIS — Z471 Aftercare following joint replacement surgery: Secondary | ICD-10-CM | POA: Diagnosis not present

## 2023-01-27 DIAGNOSIS — D6869 Other thrombophilia: Secondary | ICD-10-CM | POA: Diagnosis not present

## 2023-01-27 DIAGNOSIS — M069 Rheumatoid arthritis, unspecified: Secondary | ICD-10-CM | POA: Diagnosis not present

## 2023-01-27 DIAGNOSIS — E7800 Pure hypercholesterolemia, unspecified: Secondary | ICD-10-CM | POA: Diagnosis not present

## 2023-01-27 DIAGNOSIS — D638 Anemia in other chronic diseases classified elsewhere: Secondary | ICD-10-CM | POA: Diagnosis not present

## 2023-01-27 DIAGNOSIS — M0579 Rheumatoid arthritis with rheumatoid factor of multiple sites without organ or systems involvement: Secondary | ICD-10-CM | POA: Diagnosis not present

## 2023-01-27 DIAGNOSIS — I10 Essential (primary) hypertension: Secondary | ICD-10-CM | POA: Diagnosis not present

## 2023-01-27 DIAGNOSIS — N1831 Chronic kidney disease, stage 3a: Secondary | ICD-10-CM | POA: Diagnosis not present

## 2023-01-27 DIAGNOSIS — I4891 Unspecified atrial fibrillation: Secondary | ICD-10-CM | POA: Diagnosis not present

## 2023-01-27 DIAGNOSIS — Z0000 Encounter for general adult medical examination without abnormal findings: Secondary | ICD-10-CM | POA: Diagnosis not present

## 2023-01-27 DIAGNOSIS — I700 Atherosclerosis of aorta: Secondary | ICD-10-CM | POA: Diagnosis not present

## 2023-01-27 DIAGNOSIS — D84821 Immunodeficiency due to drugs: Secondary | ICD-10-CM | POA: Diagnosis not present

## 2023-01-30 DIAGNOSIS — Z96641 Presence of right artificial hip joint: Secondary | ICD-10-CM | POA: Diagnosis not present

## 2023-01-30 DIAGNOSIS — M25551 Pain in right hip: Secondary | ICD-10-CM | POA: Diagnosis not present

## 2023-01-30 DIAGNOSIS — R262 Difficulty in walking, not elsewhere classified: Secondary | ICD-10-CM | POA: Diagnosis not present

## 2023-02-03 DIAGNOSIS — Z96641 Presence of right artificial hip joint: Secondary | ICD-10-CM | POA: Diagnosis not present

## 2023-02-03 DIAGNOSIS — R262 Difficulty in walking, not elsewhere classified: Secondary | ICD-10-CM | POA: Diagnosis not present

## 2023-02-03 DIAGNOSIS — M25551 Pain in right hip: Secondary | ICD-10-CM | POA: Diagnosis not present

## 2023-02-05 DIAGNOSIS — R262 Difficulty in walking, not elsewhere classified: Secondary | ICD-10-CM | POA: Diagnosis not present

## 2023-02-05 DIAGNOSIS — Z96641 Presence of right artificial hip joint: Secondary | ICD-10-CM | POA: Diagnosis not present

## 2023-02-05 DIAGNOSIS — M25551 Pain in right hip: Secondary | ICD-10-CM | POA: Diagnosis not present

## 2023-02-10 DIAGNOSIS — Z96641 Presence of right artificial hip joint: Secondary | ICD-10-CM | POA: Diagnosis not present

## 2023-02-10 DIAGNOSIS — R262 Difficulty in walking, not elsewhere classified: Secondary | ICD-10-CM | POA: Diagnosis not present

## 2023-02-10 DIAGNOSIS — M25551 Pain in right hip: Secondary | ICD-10-CM | POA: Diagnosis not present

## 2023-02-12 DIAGNOSIS — M25551 Pain in right hip: Secondary | ICD-10-CM | POA: Diagnosis not present

## 2023-02-12 DIAGNOSIS — R262 Difficulty in walking, not elsewhere classified: Secondary | ICD-10-CM | POA: Diagnosis not present

## 2023-02-12 DIAGNOSIS — Z96641 Presence of right artificial hip joint: Secondary | ICD-10-CM | POA: Diagnosis not present

## 2023-02-17 DIAGNOSIS — Z96641 Presence of right artificial hip joint: Secondary | ICD-10-CM | POA: Diagnosis not present

## 2023-02-17 DIAGNOSIS — R262 Difficulty in walking, not elsewhere classified: Secondary | ICD-10-CM | POA: Diagnosis not present

## 2023-02-17 DIAGNOSIS — M25551 Pain in right hip: Secondary | ICD-10-CM | POA: Diagnosis not present

## 2023-02-18 DIAGNOSIS — I480 Paroxysmal atrial fibrillation: Secondary | ICD-10-CM | POA: Diagnosis not present

## 2023-02-19 ENCOUNTER — Other Ambulatory Visit (HOSPITAL_COMMUNITY): Payer: Self-pay

## 2023-02-19 DIAGNOSIS — I700 Atherosclerosis of aorta: Secondary | ICD-10-CM | POA: Diagnosis not present

## 2023-02-19 DIAGNOSIS — M25551 Pain in right hip: Secondary | ICD-10-CM | POA: Diagnosis not present

## 2023-02-19 DIAGNOSIS — R262 Difficulty in walking, not elsewhere classified: Secondary | ICD-10-CM | POA: Diagnosis not present

## 2023-02-19 DIAGNOSIS — N1831 Chronic kidney disease, stage 3a: Secondary | ICD-10-CM | POA: Diagnosis not present

## 2023-02-19 DIAGNOSIS — I10 Essential (primary) hypertension: Secondary | ICD-10-CM | POA: Diagnosis not present

## 2023-02-19 DIAGNOSIS — I4891 Unspecified atrial fibrillation: Secondary | ICD-10-CM | POA: Diagnosis not present

## 2023-02-19 DIAGNOSIS — Z96641 Presence of right artificial hip joint: Secondary | ICD-10-CM | POA: Diagnosis not present

## 2023-02-19 DIAGNOSIS — E7800 Pure hypercholesterolemia, unspecified: Secondary | ICD-10-CM | POA: Diagnosis not present

## 2023-02-19 DIAGNOSIS — F5081 Binge eating disorder, mild: Secondary | ICD-10-CM | POA: Diagnosis not present

## 2023-02-19 DIAGNOSIS — Z8669 Personal history of other diseases of the nervous system and sense organs: Secondary | ICD-10-CM | POA: Diagnosis not present

## 2023-02-19 DIAGNOSIS — M069 Rheumatoid arthritis, unspecified: Secondary | ICD-10-CM | POA: Diagnosis not present

## 2023-02-19 MED ORDER — WEGOVY 1 MG/0.5ML ~~LOC~~ SOAJ
1.0000 mg | SUBCUTANEOUS | 0 refills | Status: DC
Start: 2023-02-19 — End: 2023-03-18
  Filled 2023-02-19: qty 2, 28d supply, fill #0

## 2023-02-20 ENCOUNTER — Other Ambulatory Visit (HOSPITAL_BASED_OUTPATIENT_CLINIC_OR_DEPARTMENT_OTHER): Payer: Self-pay

## 2023-02-20 MED ORDER — COMIRNATY 30 MCG/0.3ML IM SUSY
PREFILLED_SYRINGE | INTRAMUSCULAR | 0 refills | Status: AC
  Filled 2023-02-20: qty 0.3, 1d supply, fill #0

## 2023-02-20 MED ORDER — FLUAD 0.5 ML IM SUSY
0.5000 mL | PREFILLED_SYRINGE | Freq: Once | INTRAMUSCULAR | 0 refills | Status: AC
  Filled 2023-02-20: qty 0.5, 1d supply, fill #0

## 2023-02-23 DIAGNOSIS — M85851 Other specified disorders of bone density and structure, right thigh: Secondary | ICD-10-CM | POA: Diagnosis not present

## 2023-02-23 DIAGNOSIS — Z96641 Presence of right artificial hip joint: Secondary | ICD-10-CM | POA: Diagnosis not present

## 2023-02-23 DIAGNOSIS — M25461 Effusion, right knee: Secondary | ICD-10-CM | POA: Diagnosis not present

## 2023-02-23 DIAGNOSIS — M76891 Other specified enthesopathies of right lower limb, excluding foot: Secondary | ICD-10-CM | POA: Diagnosis not present

## 2023-02-24 DIAGNOSIS — Z96641 Presence of right artificial hip joint: Secondary | ICD-10-CM | POA: Diagnosis not present

## 2023-02-24 DIAGNOSIS — R262 Difficulty in walking, not elsewhere classified: Secondary | ICD-10-CM | POA: Diagnosis not present

## 2023-02-24 DIAGNOSIS — M25551 Pain in right hip: Secondary | ICD-10-CM | POA: Diagnosis not present

## 2023-02-26 DIAGNOSIS — M25551 Pain in right hip: Secondary | ICD-10-CM | POA: Diagnosis not present

## 2023-02-26 DIAGNOSIS — R262 Difficulty in walking, not elsewhere classified: Secondary | ICD-10-CM | POA: Diagnosis not present

## 2023-02-26 DIAGNOSIS — Z96641 Presence of right artificial hip joint: Secondary | ICD-10-CM | POA: Diagnosis not present

## 2023-03-03 DIAGNOSIS — Z96641 Presence of right artificial hip joint: Secondary | ICD-10-CM | POA: Diagnosis not present

## 2023-03-03 DIAGNOSIS — R262 Difficulty in walking, not elsewhere classified: Secondary | ICD-10-CM | POA: Diagnosis not present

## 2023-03-03 DIAGNOSIS — M25551 Pain in right hip: Secondary | ICD-10-CM | POA: Diagnosis not present

## 2023-03-05 DIAGNOSIS — R262 Difficulty in walking, not elsewhere classified: Secondary | ICD-10-CM | POA: Diagnosis not present

## 2023-03-05 DIAGNOSIS — M25551 Pain in right hip: Secondary | ICD-10-CM | POA: Diagnosis not present

## 2023-03-05 DIAGNOSIS — Z96641 Presence of right artificial hip joint: Secondary | ICD-10-CM | POA: Diagnosis not present

## 2023-03-17 DIAGNOSIS — Z471 Aftercare following joint replacement surgery: Secondary | ICD-10-CM | POA: Diagnosis not present

## 2023-03-17 DIAGNOSIS — Z96641 Presence of right artificial hip joint: Secondary | ICD-10-CM | POA: Diagnosis not present

## 2023-03-18 ENCOUNTER — Other Ambulatory Visit (HOSPITAL_COMMUNITY): Payer: Self-pay

## 2023-03-18 DIAGNOSIS — Z96641 Presence of right artificial hip joint: Secondary | ICD-10-CM | POA: Diagnosis not present

## 2023-03-18 DIAGNOSIS — M25551 Pain in right hip: Secondary | ICD-10-CM | POA: Diagnosis not present

## 2023-03-18 DIAGNOSIS — R262 Difficulty in walking, not elsewhere classified: Secondary | ICD-10-CM | POA: Diagnosis not present

## 2023-03-18 MED ORDER — WEGOVY 1 MG/0.5ML ~~LOC~~ SOAJ
1.0000 mg | SUBCUTANEOUS | 0 refills | Status: DC
  Filled 2023-03-18: qty 2, 28d supply, fill #0

## 2023-03-25 DIAGNOSIS — F5081 Binge eating disorder, mild: Secondary | ICD-10-CM | POA: Diagnosis not present

## 2023-03-25 DIAGNOSIS — I4891 Unspecified atrial fibrillation: Secondary | ICD-10-CM | POA: Diagnosis not present

## 2023-03-25 DIAGNOSIS — M069 Rheumatoid arthritis, unspecified: Secondary | ICD-10-CM | POA: Diagnosis not present

## 2023-03-25 DIAGNOSIS — Z8669 Personal history of other diseases of the nervous system and sense organs: Secondary | ICD-10-CM | POA: Diagnosis not present

## 2023-03-25 DIAGNOSIS — E7800 Pure hypercholesterolemia, unspecified: Secondary | ICD-10-CM | POA: Diagnosis not present

## 2023-03-25 DIAGNOSIS — I10 Essential (primary) hypertension: Secondary | ICD-10-CM | POA: Diagnosis not present

## 2023-03-25 DIAGNOSIS — I700 Atherosclerosis of aorta: Secondary | ICD-10-CM | POA: Diagnosis not present

## 2023-03-25 DIAGNOSIS — N1831 Chronic kidney disease, stage 3a: Secondary | ICD-10-CM | POA: Diagnosis not present

## 2023-04-16 ENCOUNTER — Other Ambulatory Visit (HOSPITAL_BASED_OUTPATIENT_CLINIC_OR_DEPARTMENT_OTHER): Payer: Self-pay

## 2023-04-16 MED ORDER — WEGOVY 1 MG/0.5ML ~~LOC~~ SOAJ
1.0000 mg | SUBCUTANEOUS | 0 refills | Status: AC
  Filled 2023-04-16 – 2024-04-15 (×2): qty 2, 28d supply, fill #0

## 2023-04-21 ENCOUNTER — Other Ambulatory Visit (HOSPITAL_COMMUNITY): Payer: Self-pay

## 2023-04-27 DIAGNOSIS — Z8669 Personal history of other diseases of the nervous system and sense organs: Secondary | ICD-10-CM | POA: Diagnosis not present

## 2023-04-27 DIAGNOSIS — N1831 Chronic kidney disease, stage 3a: Secondary | ICD-10-CM | POA: Diagnosis not present

## 2023-04-27 DIAGNOSIS — I10 Essential (primary) hypertension: Secondary | ICD-10-CM | POA: Diagnosis not present

## 2023-04-27 DIAGNOSIS — I700 Atherosclerosis of aorta: Secondary | ICD-10-CM | POA: Diagnosis not present

## 2023-04-27 DIAGNOSIS — M069 Rheumatoid arthritis, unspecified: Secondary | ICD-10-CM | POA: Diagnosis not present

## 2023-04-27 DIAGNOSIS — I4891 Unspecified atrial fibrillation: Secondary | ICD-10-CM | POA: Diagnosis not present

## 2023-04-27 DIAGNOSIS — E7800 Pure hypercholesterolemia, unspecified: Secondary | ICD-10-CM | POA: Diagnosis not present

## 2023-04-27 DIAGNOSIS — F5081 Binge eating disorder, mild: Secondary | ICD-10-CM | POA: Diagnosis not present

## 2023-04-28 DIAGNOSIS — E663 Overweight: Secondary | ICD-10-CM | POA: Diagnosis not present

## 2023-04-28 DIAGNOSIS — Z79899 Other long term (current) drug therapy: Secondary | ICD-10-CM | POA: Diagnosis not present

## 2023-04-28 DIAGNOSIS — K5190 Ulcerative colitis, unspecified, without complications: Secondary | ICD-10-CM | POA: Diagnosis not present

## 2023-04-28 DIAGNOSIS — Z6829 Body mass index (BMI) 29.0-29.9, adult: Secondary | ICD-10-CM | POA: Diagnosis not present

## 2023-04-28 DIAGNOSIS — M1991 Primary osteoarthritis, unspecified site: Secondary | ICD-10-CM | POA: Diagnosis not present

## 2023-04-28 DIAGNOSIS — L409 Psoriasis, unspecified: Secondary | ICD-10-CM | POA: Diagnosis not present

## 2023-04-28 DIAGNOSIS — M0579 Rheumatoid arthritis with rheumatoid factor of multiple sites without organ or systems involvement: Secondary | ICD-10-CM | POA: Diagnosis not present

## 2023-05-04 DIAGNOSIS — H401131 Primary open-angle glaucoma, bilateral, mild stage: Secondary | ICD-10-CM | POA: Diagnosis not present

## 2023-05-11 ENCOUNTER — Other Ambulatory Visit (HOSPITAL_COMMUNITY): Payer: Self-pay

## 2023-05-11 MED ORDER — WEGOVY 1 MG/0.5ML ~~LOC~~ SOAJ
1.0000 mg | SUBCUTANEOUS | 0 refills | Status: AC
  Filled 2023-05-11: qty 2, 28d supply, fill #0

## 2023-06-01 DIAGNOSIS — Z8669 Personal history of other diseases of the nervous system and sense organs: Secondary | ICD-10-CM | POA: Diagnosis not present

## 2023-06-01 DIAGNOSIS — I700 Atherosclerosis of aorta: Secondary | ICD-10-CM | POA: Diagnosis not present

## 2023-06-01 DIAGNOSIS — N1831 Chronic kidney disease, stage 3a: Secondary | ICD-10-CM | POA: Diagnosis not present

## 2023-06-01 DIAGNOSIS — I4891 Unspecified atrial fibrillation: Secondary | ICD-10-CM | POA: Diagnosis not present

## 2023-06-01 DIAGNOSIS — I10 Essential (primary) hypertension: Secondary | ICD-10-CM | POA: Diagnosis not present

## 2023-06-01 DIAGNOSIS — F5081 Binge eating disorder, mild: Secondary | ICD-10-CM | POA: Diagnosis not present

## 2023-06-01 DIAGNOSIS — M069 Rheumatoid arthritis, unspecified: Secondary | ICD-10-CM | POA: Diagnosis not present

## 2023-06-01 DIAGNOSIS — E7800 Pure hypercholesterolemia, unspecified: Secondary | ICD-10-CM | POA: Diagnosis not present

## 2023-06-02 ENCOUNTER — Other Ambulatory Visit (HOSPITAL_COMMUNITY): Payer: Self-pay

## 2023-06-02 MED ORDER — WEGOVY 1 MG/0.5ML ~~LOC~~ SOAJ
1.0000 mg | SUBCUTANEOUS | 0 refills | Status: AC
  Filled 2023-06-02: qty 2, 28d supply, fill #0

## 2023-06-30 DIAGNOSIS — E7800 Pure hypercholesterolemia, unspecified: Secondary | ICD-10-CM | POA: Diagnosis not present

## 2023-06-30 DIAGNOSIS — I700 Atherosclerosis of aorta: Secondary | ICD-10-CM | POA: Diagnosis not present

## 2023-06-30 DIAGNOSIS — M069 Rheumatoid arthritis, unspecified: Secondary | ICD-10-CM | POA: Diagnosis not present

## 2023-06-30 DIAGNOSIS — I10 Essential (primary) hypertension: Secondary | ICD-10-CM | POA: Diagnosis not present

## 2023-06-30 DIAGNOSIS — N1831 Chronic kidney disease, stage 3a: Secondary | ICD-10-CM | POA: Diagnosis not present

## 2023-06-30 DIAGNOSIS — F5081 Binge eating disorder, mild: Secondary | ICD-10-CM | POA: Diagnosis not present

## 2023-06-30 DIAGNOSIS — Z8669 Personal history of other diseases of the nervous system and sense organs: Secondary | ICD-10-CM | POA: Diagnosis not present

## 2023-06-30 DIAGNOSIS — I4891 Unspecified atrial fibrillation: Secondary | ICD-10-CM | POA: Diagnosis not present

## 2023-07-01 ENCOUNTER — Other Ambulatory Visit (HOSPITAL_COMMUNITY): Payer: Self-pay

## 2023-07-01 MED ORDER — WEGOVY 1.7 MG/0.75ML ~~LOC~~ SOAJ
1.7000 mg | SUBCUTANEOUS | 0 refills | Status: DC
  Filled 2023-07-01: qty 3, 28d supply, fill #0

## 2023-07-21 DIAGNOSIS — M0579 Rheumatoid arthritis with rheumatoid factor of multiple sites without organ or systems involvement: Secondary | ICD-10-CM | POA: Diagnosis not present

## 2023-07-22 ENCOUNTER — Other Ambulatory Visit (HOSPITAL_COMMUNITY): Payer: Self-pay

## 2023-07-22 DIAGNOSIS — N1831 Chronic kidney disease, stage 3a: Secondary | ICD-10-CM | POA: Diagnosis not present

## 2023-07-22 DIAGNOSIS — F5081 Binge eating disorder, mild: Secondary | ICD-10-CM | POA: Diagnosis not present

## 2023-07-22 DIAGNOSIS — I700 Atherosclerosis of aorta: Secondary | ICD-10-CM | POA: Diagnosis not present

## 2023-07-22 DIAGNOSIS — I4891 Unspecified atrial fibrillation: Secondary | ICD-10-CM | POA: Diagnosis not present

## 2023-07-22 DIAGNOSIS — I10 Essential (primary) hypertension: Secondary | ICD-10-CM | POA: Diagnosis not present

## 2023-07-22 DIAGNOSIS — M069 Rheumatoid arthritis, unspecified: Secondary | ICD-10-CM | POA: Diagnosis not present

## 2023-07-22 DIAGNOSIS — Z8669 Personal history of other diseases of the nervous system and sense organs: Secondary | ICD-10-CM | POA: Diagnosis not present

## 2023-07-22 DIAGNOSIS — E7800 Pure hypercholesterolemia, unspecified: Secondary | ICD-10-CM | POA: Diagnosis not present

## 2023-07-22 MED ORDER — WEGOVY 1.7 MG/0.75ML ~~LOC~~ SOAJ
1.7000 mg | SUBCUTANEOUS | 0 refills | Status: DC
  Filled 2023-07-22: qty 3, 28d supply, fill #0

## 2023-07-23 DIAGNOSIS — I700 Atherosclerosis of aorta: Secondary | ICD-10-CM | POA: Diagnosis not present

## 2023-07-23 DIAGNOSIS — I4891 Unspecified atrial fibrillation: Secondary | ICD-10-CM | POA: Diagnosis not present

## 2023-07-23 DIAGNOSIS — Z8669 Personal history of other diseases of the nervous system and sense organs: Secondary | ICD-10-CM | POA: Diagnosis not present

## 2023-07-23 DIAGNOSIS — M069 Rheumatoid arthritis, unspecified: Secondary | ICD-10-CM | POA: Diagnosis not present

## 2023-07-23 DIAGNOSIS — I10 Essential (primary) hypertension: Secondary | ICD-10-CM | POA: Diagnosis not present

## 2023-07-23 DIAGNOSIS — E7800 Pure hypercholesterolemia, unspecified: Secondary | ICD-10-CM | POA: Diagnosis not present

## 2023-07-23 DIAGNOSIS — M159 Polyosteoarthritis, unspecified: Secondary | ICD-10-CM | POA: Diagnosis not present

## 2023-07-23 DIAGNOSIS — E663 Overweight: Secondary | ICD-10-CM | POA: Diagnosis not present

## 2023-07-23 DIAGNOSIS — N1831 Chronic kidney disease, stage 3a: Secondary | ICD-10-CM | POA: Diagnosis not present

## 2023-08-13 DIAGNOSIS — I471 Supraventricular tachycardia, unspecified: Secondary | ICD-10-CM | POA: Diagnosis not present

## 2023-08-13 DIAGNOSIS — I10 Essential (primary) hypertension: Secondary | ICD-10-CM | POA: Diagnosis not present

## 2023-08-13 DIAGNOSIS — I480 Paroxysmal atrial fibrillation: Secondary | ICD-10-CM | POA: Diagnosis not present

## 2023-08-20 ENCOUNTER — Other Ambulatory Visit (HOSPITAL_COMMUNITY): Payer: Self-pay

## 2023-08-20 DIAGNOSIS — I4891 Unspecified atrial fibrillation: Secondary | ICD-10-CM | POA: Diagnosis not present

## 2023-08-20 DIAGNOSIS — M069 Rheumatoid arthritis, unspecified: Secondary | ICD-10-CM | POA: Diagnosis not present

## 2023-08-20 DIAGNOSIS — N1831 Chronic kidney disease, stage 3a: Secondary | ICD-10-CM | POA: Diagnosis not present

## 2023-08-20 DIAGNOSIS — I700 Atherosclerosis of aorta: Secondary | ICD-10-CM | POA: Diagnosis not present

## 2023-08-20 DIAGNOSIS — Z8669 Personal history of other diseases of the nervous system and sense organs: Secondary | ICD-10-CM | POA: Diagnosis not present

## 2023-08-20 DIAGNOSIS — I10 Essential (primary) hypertension: Secondary | ICD-10-CM | POA: Diagnosis not present

## 2023-08-20 DIAGNOSIS — F5081 Binge eating disorder, mild: Secondary | ICD-10-CM | POA: Diagnosis not present

## 2023-08-20 DIAGNOSIS — E7800 Pure hypercholesterolemia, unspecified: Secondary | ICD-10-CM | POA: Diagnosis not present

## 2023-08-20 MED ORDER — WEGOVY 1.7 MG/0.75ML ~~LOC~~ SOAJ
1.7000 mg | SUBCUTANEOUS | 0 refills | Status: DC
  Filled 2023-08-20: qty 3, 28d supply, fill #0

## 2023-08-27 DIAGNOSIS — I480 Paroxysmal atrial fibrillation: Secondary | ICD-10-CM | POA: Diagnosis not present

## 2023-08-27 DIAGNOSIS — I10 Essential (primary) hypertension: Secondary | ICD-10-CM | POA: Diagnosis not present

## 2023-08-27 DIAGNOSIS — I471 Supraventricular tachycardia, unspecified: Secondary | ICD-10-CM | POA: Diagnosis not present

## 2023-08-28 ENCOUNTER — Other Ambulatory Visit (HOSPITAL_COMMUNITY): Payer: Self-pay

## 2023-08-28 DIAGNOSIS — N760 Acute vaginitis: Secondary | ICD-10-CM | POA: Diagnosis not present

## 2023-08-28 DIAGNOSIS — R6882 Decreased libido: Secondary | ICD-10-CM | POA: Diagnosis not present

## 2023-08-28 MED ORDER — METRONIDAZOLE 500 MG PO TABS
500.0000 mg | ORAL_TABLET | Freq: Two times a day (BID) | ORAL | 0 refills | Status: AC
  Filled 2023-08-28: qty 14, 7d supply, fill #0

## 2023-09-01 DIAGNOSIS — H401122 Primary open-angle glaucoma, left eye, moderate stage: Secondary | ICD-10-CM | POA: Diagnosis not present

## 2023-09-17 DIAGNOSIS — I471 Supraventricular tachycardia, unspecified: Secondary | ICD-10-CM | POA: Diagnosis not present

## 2023-09-17 DIAGNOSIS — I10 Essential (primary) hypertension: Secondary | ICD-10-CM | POA: Diagnosis not present

## 2023-09-17 DIAGNOSIS — I480 Paroxysmal atrial fibrillation: Secondary | ICD-10-CM | POA: Diagnosis not present

## 2023-09-22 ENCOUNTER — Other Ambulatory Visit (HOSPITAL_COMMUNITY): Payer: Self-pay

## 2023-09-22 DIAGNOSIS — N1831 Chronic kidney disease, stage 3a: Secondary | ICD-10-CM | POA: Diagnosis not present

## 2023-09-22 DIAGNOSIS — E7800 Pure hypercholesterolemia, unspecified: Secondary | ICD-10-CM | POA: Diagnosis not present

## 2023-09-22 DIAGNOSIS — I10 Essential (primary) hypertension: Secondary | ICD-10-CM | POA: Diagnosis not present

## 2023-09-22 DIAGNOSIS — Z8669 Personal history of other diseases of the nervous system and sense organs: Secondary | ICD-10-CM | POA: Diagnosis not present

## 2023-09-22 DIAGNOSIS — F5081 Binge eating disorder, mild: Secondary | ICD-10-CM | POA: Diagnosis not present

## 2023-09-22 DIAGNOSIS — I700 Atherosclerosis of aorta: Secondary | ICD-10-CM | POA: Diagnosis not present

## 2023-09-22 DIAGNOSIS — I4891 Unspecified atrial fibrillation: Secondary | ICD-10-CM | POA: Diagnosis not present

## 2023-09-22 DIAGNOSIS — M069 Rheumatoid arthritis, unspecified: Secondary | ICD-10-CM | POA: Diagnosis not present

## 2023-09-22 MED ORDER — WEGOVY 1.7 MG/0.75ML ~~LOC~~ SOAJ
1.7000 mg | SUBCUTANEOUS | 0 refills | Status: DC
  Filled 2023-09-22: qty 3, 28d supply, fill #0

## 2023-09-30 DIAGNOSIS — N898 Other specified noninflammatory disorders of vagina: Secondary | ICD-10-CM | POA: Diagnosis not present

## 2023-09-30 DIAGNOSIS — N952 Postmenopausal atrophic vaginitis: Secondary | ICD-10-CM | POA: Diagnosis not present

## 2023-10-20 ENCOUNTER — Other Ambulatory Visit (HOSPITAL_COMMUNITY): Payer: Self-pay

## 2023-10-20 DIAGNOSIS — I4891 Unspecified atrial fibrillation: Secondary | ICD-10-CM | POA: Diagnosis not present

## 2023-10-20 DIAGNOSIS — N1831 Chronic kidney disease, stage 3a: Secondary | ICD-10-CM | POA: Diagnosis not present

## 2023-10-20 DIAGNOSIS — Z8669 Personal history of other diseases of the nervous system and sense organs: Secondary | ICD-10-CM | POA: Diagnosis not present

## 2023-10-20 DIAGNOSIS — I700 Atherosclerosis of aorta: Secondary | ICD-10-CM | POA: Diagnosis not present

## 2023-10-20 DIAGNOSIS — F5081 Binge eating disorder, mild: Secondary | ICD-10-CM | POA: Diagnosis not present

## 2023-10-20 DIAGNOSIS — I10 Essential (primary) hypertension: Secondary | ICD-10-CM | POA: Diagnosis not present

## 2023-10-20 DIAGNOSIS — M069 Rheumatoid arthritis, unspecified: Secondary | ICD-10-CM | POA: Diagnosis not present

## 2023-10-20 DIAGNOSIS — E7800 Pure hypercholesterolemia, unspecified: Secondary | ICD-10-CM | POA: Diagnosis not present

## 2023-10-20 MED ORDER — WEGOVY 1.7 MG/0.75ML ~~LOC~~ SOAJ
1.7000 mg | SUBCUTANEOUS | 3 refills | Status: AC
  Filled 2023-10-20: qty 3, 28d supply, fill #0
  Filled 2023-11-24: qty 3, 28d supply, fill #1
  Filled 2023-12-22: qty 3, 28d supply, fill #2
  Filled 2024-01-16: qty 3, 28d supply, fill #3

## 2023-11-09 DIAGNOSIS — M0579 Rheumatoid arthritis with rheumatoid factor of multiple sites without organ or systems involvement: Secondary | ICD-10-CM | POA: Diagnosis not present

## 2023-11-09 DIAGNOSIS — E663 Overweight: Secondary | ICD-10-CM | POA: Diagnosis not present

## 2023-11-09 DIAGNOSIS — K5190 Ulcerative colitis, unspecified, without complications: Secondary | ICD-10-CM | POA: Diagnosis not present

## 2023-11-09 DIAGNOSIS — L409 Psoriasis, unspecified: Secondary | ICD-10-CM | POA: Diagnosis not present

## 2023-11-09 DIAGNOSIS — Z6826 Body mass index (BMI) 26.0-26.9, adult: Secondary | ICD-10-CM | POA: Diagnosis not present

## 2023-11-09 DIAGNOSIS — Z79899 Other long term (current) drug therapy: Secondary | ICD-10-CM | POA: Diagnosis not present

## 2023-11-09 DIAGNOSIS — M25511 Pain in right shoulder: Secondary | ICD-10-CM | POA: Diagnosis not present

## 2023-11-09 DIAGNOSIS — M1991 Primary osteoarthritis, unspecified site: Secondary | ICD-10-CM | POA: Diagnosis not present

## 2023-11-10 ENCOUNTER — Other Ambulatory Visit: Payer: Self-pay | Admitting: Internal Medicine

## 2023-11-10 DIAGNOSIS — Z1231 Encounter for screening mammogram for malignant neoplasm of breast: Secondary | ICD-10-CM

## 2023-11-16 DIAGNOSIS — I272 Pulmonary hypertension, unspecified: Secondary | ICD-10-CM | POA: Diagnosis not present

## 2023-11-16 DIAGNOSIS — R109 Unspecified abdominal pain: Secondary | ICD-10-CM | POA: Diagnosis not present

## 2023-11-16 DIAGNOSIS — N1831 Chronic kidney disease, stage 3a: Secondary | ICD-10-CM | POA: Diagnosis not present

## 2023-11-16 DIAGNOSIS — I4891 Unspecified atrial fibrillation: Secondary | ICD-10-CM | POA: Diagnosis not present

## 2023-11-16 DIAGNOSIS — D6869 Other thrombophilia: Secondary | ICD-10-CM | POA: Diagnosis not present

## 2023-11-16 DIAGNOSIS — M069 Rheumatoid arthritis, unspecified: Secondary | ICD-10-CM | POA: Diagnosis not present

## 2023-11-16 DIAGNOSIS — I10 Essential (primary) hypertension: Secondary | ICD-10-CM | POA: Diagnosis not present

## 2023-11-16 DIAGNOSIS — D84821 Immunodeficiency due to drugs: Secondary | ICD-10-CM | POA: Diagnosis not present

## 2023-11-16 DIAGNOSIS — I700 Atherosclerosis of aorta: Secondary | ICD-10-CM | POA: Diagnosis not present

## 2023-11-19 DIAGNOSIS — I700 Atherosclerosis of aorta: Secondary | ICD-10-CM | POA: Diagnosis not present

## 2023-11-19 DIAGNOSIS — Z8669 Personal history of other diseases of the nervous system and sense organs: Secondary | ICD-10-CM | POA: Diagnosis not present

## 2023-11-19 DIAGNOSIS — E7800 Pure hypercholesterolemia, unspecified: Secondary | ICD-10-CM | POA: Diagnosis not present

## 2023-11-19 DIAGNOSIS — F5081 Binge eating disorder, mild: Secondary | ICD-10-CM | POA: Diagnosis not present

## 2023-11-19 DIAGNOSIS — M792 Neuralgia and neuritis, unspecified: Secondary | ICD-10-CM | POA: Diagnosis not present

## 2023-11-19 DIAGNOSIS — I4891 Unspecified atrial fibrillation: Secondary | ICD-10-CM | POA: Diagnosis not present

## 2023-11-19 DIAGNOSIS — N1831 Chronic kidney disease, stage 3a: Secondary | ICD-10-CM | POA: Diagnosis not present

## 2023-11-19 DIAGNOSIS — M069 Rheumatoid arthritis, unspecified: Secondary | ICD-10-CM | POA: Diagnosis not present

## 2023-11-19 DIAGNOSIS — I10 Essential (primary) hypertension: Secondary | ICD-10-CM | POA: Diagnosis not present

## 2023-11-20 DIAGNOSIS — I471 Supraventricular tachycardia, unspecified: Secondary | ICD-10-CM | POA: Diagnosis not present

## 2023-11-20 DIAGNOSIS — I480 Paroxysmal atrial fibrillation: Secondary | ICD-10-CM | POA: Diagnosis not present

## 2023-11-20 DIAGNOSIS — R002 Palpitations: Secondary | ICD-10-CM | POA: Diagnosis not present

## 2023-11-20 DIAGNOSIS — I10 Essential (primary) hypertension: Secondary | ICD-10-CM | POA: Diagnosis not present

## 2023-11-24 ENCOUNTER — Other Ambulatory Visit (HOSPITAL_COMMUNITY): Payer: Self-pay

## 2023-12-02 DIAGNOSIS — M069 Rheumatoid arthritis, unspecified: Secondary | ICD-10-CM | POA: Diagnosis not present

## 2023-12-02 DIAGNOSIS — R300 Dysuria: Secondary | ICD-10-CM | POA: Diagnosis not present

## 2023-12-02 DIAGNOSIS — D84821 Immunodeficiency due to drugs: Secondary | ICD-10-CM | POA: Diagnosis not present

## 2023-12-02 DIAGNOSIS — R109 Unspecified abdominal pain: Secondary | ICD-10-CM | POA: Diagnosis not present

## 2023-12-07 ENCOUNTER — Ambulatory Visit
Admission: RE | Admit: 2023-12-07 | Discharge: 2023-12-07 | Disposition: A | Discharge status: Home / Self Care | Source: Ambulatory Visit | Attending: Internal Medicine | Admitting: Internal Medicine

## 2023-12-07 ENCOUNTER — Other Ambulatory Visit: Payer: Self-pay | Admitting: Internal Medicine

## 2023-12-07 ENCOUNTER — Encounter: Payer: Self-pay | Admitting: Internal Medicine

## 2023-12-07 DIAGNOSIS — R1032 Left lower quadrant pain: Secondary | ICD-10-CM | POA: Diagnosis not present

## 2023-12-07 DIAGNOSIS — R109 Unspecified abdominal pain: Secondary | ICD-10-CM

## 2023-12-22 DIAGNOSIS — F5081 Binge eating disorder, mild: Secondary | ICD-10-CM | POA: Diagnosis not present

## 2023-12-22 DIAGNOSIS — I700 Atherosclerosis of aorta: Secondary | ICD-10-CM | POA: Diagnosis not present

## 2023-12-22 DIAGNOSIS — M792 Neuralgia and neuritis, unspecified: Secondary | ICD-10-CM | POA: Diagnosis not present

## 2023-12-22 DIAGNOSIS — I10 Essential (primary) hypertension: Secondary | ICD-10-CM | POA: Diagnosis not present

## 2023-12-22 DIAGNOSIS — Z8669 Personal history of other diseases of the nervous system and sense organs: Secondary | ICD-10-CM | POA: Diagnosis not present

## 2023-12-22 DIAGNOSIS — I4891 Unspecified atrial fibrillation: Secondary | ICD-10-CM | POA: Diagnosis not present

## 2023-12-22 DIAGNOSIS — N1831 Chronic kidney disease, stage 3a: Secondary | ICD-10-CM | POA: Diagnosis not present

## 2023-12-22 DIAGNOSIS — E7800 Pure hypercholesterolemia, unspecified: Secondary | ICD-10-CM | POA: Diagnosis not present

## 2023-12-22 DIAGNOSIS — M069 Rheumatoid arthritis, unspecified: Secondary | ICD-10-CM | POA: Diagnosis not present

## 2023-12-23 ENCOUNTER — Other Ambulatory Visit (HOSPITAL_COMMUNITY): Payer: Self-pay

## 2023-12-23 MED ORDER — WEGOVY 1.7 MG/0.75ML ~~LOC~~ SOAJ
1.7000 mg | SUBCUTANEOUS | 3 refills | Status: AC
  Filled 2023-12-23 – 2024-02-15 (×2): qty 3, 28d supply, fill #0
  Filled 2024-05-20: qty 3, 28d supply, fill #1

## 2023-12-30 ENCOUNTER — Ambulatory Visit
Admission: RE | Admit: 2023-12-30 | Discharge: 2023-12-30 | Disposition: A | Discharge status: Home / Self Care | Source: Ambulatory Visit | Attending: Internal Medicine | Admitting: Internal Medicine

## 2023-12-30 DIAGNOSIS — Z1231 Encounter for screening mammogram for malignant neoplasm of breast: Secondary | ICD-10-CM | POA: Diagnosis not present

## 2024-01-05 DIAGNOSIS — H401122 Primary open-angle glaucoma, left eye, moderate stage: Secondary | ICD-10-CM | POA: Diagnosis not present

## 2024-01-21 DIAGNOSIS — M069 Rheumatoid arthritis, unspecified: Secondary | ICD-10-CM | POA: Diagnosis not present

## 2024-01-21 DIAGNOSIS — Z8669 Personal history of other diseases of the nervous system and sense organs: Secondary | ICD-10-CM | POA: Diagnosis not present

## 2024-01-21 DIAGNOSIS — I700 Atherosclerosis of aorta: Secondary | ICD-10-CM | POA: Diagnosis not present

## 2024-01-21 DIAGNOSIS — M792 Neuralgia and neuritis, unspecified: Secondary | ICD-10-CM | POA: Diagnosis not present

## 2024-01-21 DIAGNOSIS — F5081 Binge eating disorder, mild: Secondary | ICD-10-CM | POA: Diagnosis not present

## 2024-01-21 DIAGNOSIS — I4891 Unspecified atrial fibrillation: Secondary | ICD-10-CM | POA: Diagnosis not present

## 2024-01-21 DIAGNOSIS — I10 Essential (primary) hypertension: Secondary | ICD-10-CM | POA: Diagnosis not present

## 2024-01-21 DIAGNOSIS — N1831 Chronic kidney disease, stage 3a: Secondary | ICD-10-CM | POA: Diagnosis not present

## 2024-01-21 DIAGNOSIS — E7800 Pure hypercholesterolemia, unspecified: Secondary | ICD-10-CM | POA: Diagnosis not present

## 2024-01-25 ENCOUNTER — Other Ambulatory Visit (HOSPITAL_BASED_OUTPATIENT_CLINIC_OR_DEPARTMENT_OTHER): Payer: Self-pay

## 2024-01-25 MED ORDER — FLUZONE HIGH-DOSE 0.5 ML IM SUSY
0.5000 mL | PREFILLED_SYRINGE | Freq: Once | INTRAMUSCULAR | 0 refills | Status: AC
  Filled 2024-01-25: qty 0.5, 1d supply, fill #0

## 2024-01-25 MED ORDER — COMIRNATY 30 MCG/0.3ML IM SUSY
0.3000 mL | PREFILLED_SYRINGE | Freq: Once | INTRAMUSCULAR | 0 refills | Status: AC
  Filled 2024-01-25: qty 0.3, 1d supply, fill #0

## 2024-02-03 DIAGNOSIS — M069 Rheumatoid arthritis, unspecified: Secondary | ICD-10-CM | POA: Diagnosis not present

## 2024-02-03 DIAGNOSIS — N1831 Chronic kidney disease, stage 3a: Secondary | ICD-10-CM | POA: Diagnosis not present

## 2024-02-03 DIAGNOSIS — D6869 Other thrombophilia: Secondary | ICD-10-CM | POA: Diagnosis not present

## 2024-02-03 DIAGNOSIS — E7800 Pure hypercholesterolemia, unspecified: Secondary | ICD-10-CM | POA: Diagnosis not present

## 2024-02-03 DIAGNOSIS — D638 Anemia in other chronic diseases classified elsewhere: Secondary | ICD-10-CM | POA: Diagnosis not present

## 2024-02-03 DIAGNOSIS — I700 Atherosclerosis of aorta: Secondary | ICD-10-CM | POA: Diagnosis not present

## 2024-02-03 DIAGNOSIS — F5081 Binge eating disorder, mild: Secondary | ICD-10-CM | POA: Diagnosis not present

## 2024-02-03 DIAGNOSIS — I10 Essential (primary) hypertension: Secondary | ICD-10-CM | POA: Diagnosis not present

## 2024-02-03 DIAGNOSIS — I4891 Unspecified atrial fibrillation: Secondary | ICD-10-CM | POA: Diagnosis not present

## 2024-02-03 DIAGNOSIS — Z0000 Encounter for general adult medical examination without abnormal findings: Secondary | ICD-10-CM | POA: Diagnosis not present

## 2024-02-03 DIAGNOSIS — Z6826 Body mass index (BMI) 26.0-26.9, adult: Secondary | ICD-10-CM | POA: Diagnosis not present

## 2024-02-03 DIAGNOSIS — E2839 Other primary ovarian failure: Secondary | ICD-10-CM | POA: Diagnosis not present

## 2024-02-10 DIAGNOSIS — M0579 Rheumatoid arthritis with rheumatoid factor of multiple sites without organ or systems involvement: Secondary | ICD-10-CM | POA: Diagnosis not present

## 2024-02-10 DIAGNOSIS — N898 Other specified noninflammatory disorders of vagina: Secondary | ICD-10-CM | POA: Diagnosis not present

## 2024-02-10 DIAGNOSIS — N958 Other specified menopausal and perimenopausal disorders: Secondary | ICD-10-CM | POA: Diagnosis not present

## 2024-02-11 DIAGNOSIS — I471 Supraventricular tachycardia, unspecified: Secondary | ICD-10-CM | POA: Diagnosis not present

## 2024-02-11 DIAGNOSIS — I480 Paroxysmal atrial fibrillation: Secondary | ICD-10-CM | POA: Diagnosis not present

## 2024-02-15 ENCOUNTER — Other Ambulatory Visit (HOSPITAL_COMMUNITY): Payer: Self-pay

## 2024-02-15 MED ORDER — WEGOVY 1.7 MG/0.75ML ~~LOC~~ SOAJ
1.7000 mg | SUBCUTANEOUS | 0 refills | Status: AC
  Filled 2024-02-15 – 2024-03-08 (×2): qty 3, 28d supply, fill #0

## 2024-03-01 DIAGNOSIS — I4891 Unspecified atrial fibrillation: Secondary | ICD-10-CM | POA: Diagnosis not present

## 2024-03-01 DIAGNOSIS — I10 Essential (primary) hypertension: Secondary | ICD-10-CM | POA: Diagnosis not present

## 2024-03-01 DIAGNOSIS — N1831 Chronic kidney disease, stage 3a: Secondary | ICD-10-CM | POA: Diagnosis not present

## 2024-03-01 DIAGNOSIS — M069 Rheumatoid arthritis, unspecified: Secondary | ICD-10-CM | POA: Diagnosis not present

## 2024-03-01 DIAGNOSIS — E7800 Pure hypercholesterolemia, unspecified: Secondary | ICD-10-CM | POA: Diagnosis not present

## 2024-03-01 DIAGNOSIS — I700 Atherosclerosis of aorta: Secondary | ICD-10-CM | POA: Diagnosis not present

## 2024-03-01 DIAGNOSIS — Z8669 Personal history of other diseases of the nervous system and sense organs: Secondary | ICD-10-CM | POA: Diagnosis not present

## 2024-03-01 DIAGNOSIS — F5081 Binge eating disorder, mild: Secondary | ICD-10-CM | POA: Diagnosis not present

## 2024-03-07 ENCOUNTER — Other Ambulatory Visit: Payer: Self-pay

## 2024-03-08 ENCOUNTER — Other Ambulatory Visit (HOSPITAL_COMMUNITY): Payer: Self-pay

## 2024-03-09 ENCOUNTER — Other Ambulatory Visit (HOSPITAL_COMMUNITY): Payer: Self-pay

## 2024-03-15 DIAGNOSIS — Z780 Asymptomatic menopausal state: Secondary | ICD-10-CM | POA: Diagnosis not present

## 2024-04-15 ENCOUNTER — Other Ambulatory Visit (HOSPITAL_COMMUNITY): Payer: Self-pay

## 2024-04-20 ENCOUNTER — Other Ambulatory Visit (HOSPITAL_COMMUNITY): Payer: Self-pay

## 2024-04-20 MED ORDER — WEGOVY 1.7 MG/0.75ML ~~LOC~~ SOAJ
1.7000 mg | SUBCUTANEOUS | 3 refills | Status: AC
  Filled 2024-04-20: qty 3, 28d supply, fill #0

## 2024-05-20 ENCOUNTER — Other Ambulatory Visit (HOSPITAL_COMMUNITY): Payer: Self-pay
